# Patient Record
Sex: Female | Born: 1945 | Race: White | Hispanic: No | Marital: Single | Smoking: Former smoker
Health system: Southern US, Community
[De-identification: ages and names within clinical notes are randomized; demographics above are authoritative.]

## PROBLEM LIST (undated history)

## (undated) DIAGNOSIS — I639 Cerebral infarction, unspecified: Secondary | ICD-10-CM

## (undated) DIAGNOSIS — I6529 Occlusion and stenosis of unspecified carotid artery: Secondary | ICD-10-CM

## (undated) DIAGNOSIS — I10 Essential (primary) hypertension: Secondary | ICD-10-CM

## (undated) DIAGNOSIS — E785 Hyperlipidemia, unspecified: Secondary | ICD-10-CM

## (undated) HISTORY — DX: Hyperlipidemia, unspecified: E78.5

## (undated) HISTORY — PX: OTHER SURGICAL HISTORY: SHX169

## (undated) HISTORY — DX: Essential (primary) hypertension: I10

## (undated) HISTORY — DX: Cerebral infarction, unspecified: I63.9

## (undated) HISTORY — PX: FRACTURE SURGERY: SHX138

## (undated) HISTORY — PX: CHOLECYSTECTOMY: SHX55

## (undated) HISTORY — DX: Occlusion and stenosis of unspecified carotid artery: I65.29

---

## 2003-10-03 ENCOUNTER — Emergency Department (HOSPITAL_COMMUNITY): Admission: EM | Admit: 2003-10-03 | Discharge: 2003-10-03 | Payer: Self-pay | Admitting: Emergency Medicine

## 2003-10-05 ENCOUNTER — Inpatient Hospital Stay (HOSPITAL_COMMUNITY): Admission: RE | Admit: 2003-10-05 | Discharge: 2003-10-06 | Payer: Self-pay | Admitting: Orthopedic Surgery

## 2012-11-14 ENCOUNTER — Encounter: Payer: Self-pay | Admitting: Vascular Surgery

## 2012-11-14 ENCOUNTER — Other Ambulatory Visit: Payer: Self-pay | Admitting: *Deleted

## 2012-11-19 ENCOUNTER — Encounter: Payer: Self-pay | Admitting: Vascular Surgery

## 2012-11-19 ENCOUNTER — Other Ambulatory Visit: Payer: Self-pay

## 2012-12-18 ENCOUNTER — Other Ambulatory Visit: Payer: Self-pay

## 2012-12-18 ENCOUNTER — Encounter: Payer: Self-pay | Admitting: Vascular Surgery

## 2012-12-24 ENCOUNTER — Encounter: Payer: Self-pay | Admitting: Vascular Surgery

## 2012-12-25 ENCOUNTER — Ambulatory Visit (HOSPITAL_COMMUNITY)
Admission: RE | Admit: 2012-12-25 | Discharge: 2012-12-25 | Disposition: A | Discharge status: Home / Self Care | Payer: Medicare Other | Source: Ambulatory Visit | Attending: Vascular Surgery | Admitting: Vascular Surgery

## 2012-12-25 ENCOUNTER — Ambulatory Visit (INDEPENDENT_AMBULATORY_CARE_PROVIDER_SITE_OTHER): Payer: Medicare Other | Admitting: Vascular Surgery

## 2012-12-25 ENCOUNTER — Encounter: Payer: Self-pay | Admitting: Vascular Surgery

## 2012-12-25 DIAGNOSIS — I6529 Occlusion and stenosis of unspecified carotid artery: Secondary | ICD-10-CM | POA: Insufficient documentation

## 2012-12-25 NOTE — Progress Notes (Signed)
[facility] [place] HISTORY AND PHYSICAL   History of Present Illness:  Patient is a 67 y.o. 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 The patient had a known history of carotid stenosis. This is been followed with surveillance ultrasound. She was noted on recent ultrasound had bilateral internal carotid occlusions. This apparently occurred asymptomatically. The patient did have a stroke in 1995 which left her with some residual right leg weakness. She has had no further neurologic events since then. She denies any recent symptoms of TIA amaurosis or stroke. She has multiple atherosclerotic risk factors. She is on aspirin. He is on a statin agent..  Other medical problems include hypertension, hyperlipidemia prior history of stroke.  Past Medical History  Diagnosis Date  . Hypertension   . Carotid artery occlusion   . Hyperlipidemia   . Stroke     Past Surgical History  Procedure Laterality Date  . Cholecystectomy      Social History History  Substance Use Topics  . Smoking status: Former Smoker    Types: Cigarettes    Quit date: 03/26/1994  . Smokeless tobacco: Not on file  . Alcohol Use: No    Family History No family history on file. The patient has no history of early vascular disease of family members age less than 50. Allergies  Allergies  Allergen Reactions  . Doxycycline     Rash on hands     Current Outpatient Prescriptions  Medication Sig Dispense Refill  . amLODipine (NORVASC) 10 MG tablet Take 10 mg by mouth daily.      Marland Kitchen aspirin 325 MG tablet Take 325 mg by mouth daily.      Marland Kitchen atorvastatin (LIPITOR) 40 MG tablet Take 40 mg by mouth daily.      Marland Kitchen lisinopril-hydrochlorothiazide (PRINZIDE,ZESTORETIC) 20-25 MG per tablet Take 1 tablet by mouth daily.       No current facility-administered medications for this visit.    ROS:   General:  No weight loss, Fever, chills  HEENT: No recent  headaches, no nasal bleeding, no visual changes, no sore throat  Neurologic: No dizziness, blackouts, seizures. No recent symptoms of stroke or mini- stroke. No recent episodes of slurred speech, or temporary blindness.  Cardiac: No recent episodes of chest pain/pressure, no shortness of breath at rest.  No shortness of breath with exertion.  Denies history of atrial fibrillation or irregular heartbeat  Vascular: No history of rest pain in feet.  No history of claudication.  No history of non-healing ulcer, No history of DVT   Pulmonary: No home oxygen, no productive cough, no hemoptysis,  No asthma or wheezing  Musculoskeletal:  [ ]  Arthritis, [ ]  Low back pain,  [ ]  Joint pain  Hematologic:No history of hypercoagulable state.  No history of easy bleeding.  No history of anemia  Gastrointestinal: No hematochezia or melena,  No gastroesophageal reflux, no trouble swallowing  Urinary: [ ]  chronic Kidney disease, [ ]  on HD - [ ]  MWF or [ ]  TTHS, [ ]  Burning with urination, [ ]  Frequent urination, [ ]  Difficulty urinating;   Skin: No rashes  Psychological: No history of anxiety,  No history of depression   Physical Examination  Filed Vitals:   12/25/12 1110 12/25/12 1113  BP: 152/67 145/64  Pulse: 71 71  Height: 5\' 1"  (1.549 m)   Weight: 171 lb 1.6 oz (77.61 kg)   SpO2: 100%     Body mass index  is 32.35 kg/(m^2).  General:  Alert and oriented, no acute distress HEENT: Normal Neck: Harsh right side carotid bruit, absent left carotid bruit Pulmonary: Clear to auscultation bilaterally Cardiac: Regular Rate and Rhythm without murmur Abdomen: Soft, non-tender, non-distended, no mass Skin: No rash Extremity Pulses:  2+ radial, brachial, femoral bilaterally Musculoskeletal: No deformity or edema  Neurologic: Upper and lower extremity motor 5/5 and symmetric  DATA:  Carotid duplex scan from inside imaging was reviewed today which shows bilateral internal carotid artery occlusions.  There was antegrade vertebral flow bilaterally.   ASSESSMENT:  Bilateral internal carotid artery occlusion by ultrasound asymptomatic   PLAN:  Carotid angiogram to confirm ultrasound findings scheduled for 01/02/2013. Risks benefits possible complications and procedure details including but not limited to bleeding infection vessel injury contrast reaction stroke explained the patient today. She understands and agrees to proceed  Fabienne Bruns, MD [facility] [place] Office: [phone] Pager: [phone]

## 2012-12-29 ENCOUNTER — Other Ambulatory Visit: Payer: Self-pay

## 2013-01-01 MED ORDER — SODIUM CHLORIDE 0.9 % IV SOLN
INTRAVENOUS | Status: DC
  Administered 2013-01-02: 09:00:00 via INTRAVENOUS
  Administered 2013-01-02: 100 mL/h via INTRAVENOUS

## 2013-01-02 ENCOUNTER — Encounter (HOSPITAL_COMMUNITY): Payer: Self-pay | Admitting: *Deleted

## 2013-01-02 ENCOUNTER — Ambulatory Visit (HOSPITAL_COMMUNITY)
Admission: RE | Admit: 2013-01-02 | Discharge: 2013-01-02 | Disposition: A | Discharge status: Home / Self Care | Payer: Medicare Other | Source: Ambulatory Visit | Attending: Vascular Surgery | Admitting: Vascular Surgery

## 2013-01-02 ENCOUNTER — Encounter (HOSPITAL_COMMUNITY)
Admission: RE | Disposition: A | Discharge status: Home / Self Care | Payer: Self-pay | Source: Ambulatory Visit | Attending: Vascular Surgery

## 2013-01-02 DIAGNOSIS — E785 Hyperlipidemia, unspecified: Secondary | ICD-10-CM | POA: Insufficient documentation

## 2013-01-02 DIAGNOSIS — I658 Occlusion and stenosis of other precerebral arteries: Secondary | ICD-10-CM | POA: Insufficient documentation

## 2013-01-02 DIAGNOSIS — I69998 Other sequelae following unspecified cerebrovascular disease: Secondary | ICD-10-CM | POA: Insufficient documentation

## 2013-01-02 DIAGNOSIS — I10 Essential (primary) hypertension: Secondary | ICD-10-CM | POA: Insufficient documentation

## 2013-01-02 DIAGNOSIS — I6529 Occlusion and stenosis of unspecified carotid artery: Secondary | ICD-10-CM

## 2013-01-02 DIAGNOSIS — R29898 Other symptoms and signs involving the musculoskeletal system: Secondary | ICD-10-CM | POA: Insufficient documentation

## 2013-01-02 HISTORY — PX: CAROTID ANGIOGRAM: SHX5504

## 2013-01-02 LAB — POCT I-STAT, CHEM 8
BUN: 26 mg/dL — ABNORMAL HIGH (ref 6–23)
Calcium, Ion: 1.35 mmol/L — ABNORMAL HIGH (ref 1.13–1.30)
Chloride: 101 meq/L (ref 96–112)
Creatinine, Ser: 1.1 mg/dL (ref 0.50–1.10)
Glucose, Bld: 112 mg/dL — ABNORMAL HIGH (ref 70–99)
HCT: 39 % (ref 36.0–46.0)
Hemoglobin: 13.3 g/dL (ref 12.0–15.0)
Potassium: 4 meq/L (ref 3.5–5.1)
Sodium: 138 meq/L (ref 135–145)
TCO2: 27 mmol/L (ref 0–100)

## 2013-01-02 SURGERY — CAROTID ANGIOGRAM
Anesthesia: LOCAL

## 2013-01-02 MED ORDER — HEPARIN (PORCINE) IN NACL 2-0.9 UNIT/ML-% IJ SOLN
INTRAMUSCULAR | Status: AC
  Filled 2013-01-02: qty 1000

## 2013-01-02 MED ORDER — HYDRALAZINE HCL 20 MG/ML IJ SOLN: 10.0000 mg | INTRAMUSCULAR | Status: DC | PRN

## 2013-01-02 MED ORDER — ACETAMINOPHEN 325 MG PO TABS: 325.0000 mg | ORAL_TABLET | ORAL | Status: DC | PRN

## 2013-01-02 MED ORDER — MORPHINE SULFATE 10 MG/ML IJ SOLN: 2.0000 mg | INTRAMUSCULAR | Status: DC | PRN

## 2013-01-02 MED ORDER — METOPROLOL TARTRATE 1 MG/ML IV SOLN: 2.0000 mg | INTRAVENOUS | Status: DC | PRN

## 2013-01-02 MED ORDER — LABETALOL HCL 5 MG/ML IV SOLN: 10.0000 mg | INTRAVENOUS | Status: DC | PRN

## 2013-01-02 MED ORDER — SODIUM CHLORIDE 0.45 % IV SOLN: INTRAVENOUS | Status: DC

## 2013-01-02 MED ORDER — ACETAMINOPHEN 325 MG RE SUPP: 325.0000 mg | RECTAL | Status: DC | PRN

## 2013-01-02 MED ORDER — ONDANSETRON HCL 4 MG/2ML IJ SOLN: 4.0000 mg | Freq: Four times a day (QID) | INTRAMUSCULAR | Status: DC | PRN

## 2013-01-02 MED ORDER — LIDOCAINE HCL (PF) 1 % IJ SOLN
INTRAMUSCULAR | Status: AC
  Filled 2013-01-02: qty 30

## 2013-01-02 NOTE — Interval H&P Note (Signed)
History and Physical Interval Note:  01/02/2013 7:35 AM  Michele Ellis  has presented today for surgery, with the diagnosis of carotid stenosis  The various methods of treatment have been discussed with the patient and family. After consideration of risks, benefits and other options for treatment, the patient has consented to  Procedure(s): CAROTID ANGIOGRAM (N/A) as a surgical intervention .  The patient's history has been reviewed, patient examined, no change in status, stable for surgery.  I have reviewed the patient's chart and labs.  Questions were answered to the patient's satisfaction.     FIELDS,CHARLES E

## 2013-01-02 NOTE — Op Note (Addendum)
Procedure: Arch aortogram with bilateral carotid angiogram Preoperative diagnosis: Bilateral internal carotid artery occlusion Postoperative diagnosis: Right internal carotid stenosis, left internal carotid occlusion  Anesthesia: Local   Operative details: After obtaining informed consent, the patient was taken to the PV lab. The patient was placed in supine position the Angio table. Both groins were prepped and draped in usual sterile fashion. Ultrasound was used to identify the right common femoral artery.  Local anesthesia was infiltrated over the right common femoral artery. An introducer needle was placed into the right common femoral artery using ultrasound guidance and there was good backbleeding from this.  Next and 0.035 Versacore wire was threaded up into the abdominal aorta and into the aortic arch under fluoroscopic guidance. A 5 French sheath was  the guidewire the right common femoral artery. This was thoroughly flushed with heparinized saline. A 5 French pigtail catheter was then placed through the guidewire advanced up in the aortic arch and arch aortogram obtained in a 40 LAO view. This shows normal arch configuration with patent right subclavian artery. The right vertebral artery is patent with antegrade flow. The innominate artery is patent. The right common carotid artery is patent. The left common carotid artery has a shelf like plaque at the origin causing approximately 50% stenosis. The left subclavian artery is patent. The left vertebral artery is patent with antegrade flow.  The 5 French pigtail catheter was then pulled back over the guidewire and exchanged for a Berenstein 2 catheter. This was then used to selectively catheterize the right common carotid artery.Injection was then performed through the right common carotid artery to the define the right carotid system. Projections were done in AP and lateral projection. Intracranial views were also performed to be interpreted later  by the neuroradiologist.The right internal  Carotid has an irregular 80% stenosis.  The bifurcation is slightly high but the plaque ends below C2 and the angle of the mandible.  The external carotid is patent.    Next the Berenstein catheter was pulled back over guidewire and I was able to advance the versacore wire into the left common carotid artery.  the [place] was then exchanged over the wire for a 5 Fr straight slip cath.  After advancing the catheter into the left common carotid artery left carotid injection was performed also in AP and lateral projection. Intracranial views were also performed in AP and lateral projection to be interpreted by the neuroradiologist. The left internal is occluded and external carotid artery is patent.  Next, the Berenstein catheter was pulled back over the guidewire and out. The 5 French sheath was thoroughly flushed with heparinized saline. The patient was taken to the holding area in stable condition.  The patient tolerated the procedure well and there were no neurologic complications. The patient was transported to the holding area in stable condition. She was having a rainbow of colors consistent with ocular migraine at the end of the procedure corner of both eyes ? Left > right  Operative findings: left internal carotid occlusion.  80% right internal carotid stenosis  Disposition: Pt was offered left carotid endarterectomy for stroke prophylaxis.  She will be observed this afternoon to make sure her eye symptoms resolve.  The patient will call if she wishes to undergo CEA.  Fabienne Bruns, MD  [facility] [place]  Office: [phone]  Pager: [phone]

## 2013-01-02 NOTE — H&P (View-Only) (Signed)
[facility] [place] HISTORY AND PHYSICAL   History of Present Illness:  Patient is a 67 y.o. 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 The patient had a known history of carotid stenosis. This is been followed with surveillance ultrasound. She was noted on recent ultrasound had bilateral internal carotid occlusions. This apparently occurred asymptomatically. The patient did have a stroke in 1995 which left her with some residual right leg weakness. She has had no further neurologic events since then. She denies any recent symptoms of TIA amaurosis or stroke. She has multiple atherosclerotic risk factors. She is on aspirin. He is on a statin agent..  Other medical problems include hypertension, hyperlipidemia prior history of stroke.  Past Medical History  Diagnosis Date  . Hypertension   . Carotid artery occlusion   . Hyperlipidemia   . Stroke     Past Surgical History  Procedure Laterality Date  . Cholecystectomy      Social History History  Substance Use Topics  . Smoking status: Former Smoker    Types: Cigarettes    Quit date: 03/26/1994  . Smokeless tobacco: Not on file  . Alcohol Use: No    Family History No family history on file. The patient has no history of early vascular disease of family members age less than 50. Allergies  Allergies  Allergen Reactions  . Doxycycline     Rash on hands     Current Outpatient Prescriptions  Medication Sig Dispense Refill  . amLODipine (NORVASC) 10 MG tablet Take 10 mg by mouth daily.      . aspirin 325 MG tablet Take 325 mg by mouth daily.      . atorvastatin (LIPITOR) 40 MG tablet Take 40 mg by mouth daily.      . lisinopril-hydrochlorothiazide (PRINZIDE,ZESTORETIC) 20-25 MG per tablet Take 1 tablet by mouth daily.       No current facility-administered medications for this visit.    ROS:   General:  No weight loss, Fever, chills  HEENT: No recent  headaches, no nasal bleeding, no visual changes, no sore throat  Neurologic: No dizziness, blackouts, seizures. No recent symptoms of stroke or mini- stroke. No recent episodes of slurred speech, or temporary blindness.  Cardiac: No recent episodes of chest pain/pressure, no shortness of breath at rest.  No shortness of breath with exertion.  Denies history of atrial fibrillation or irregular heartbeat  Vascular: No history of rest pain in feet.  No history of claudication.  No history of non-healing ulcer, No history of DVT   Pulmonary: No home oxygen, no productive cough, no hemoptysis,  No asthma or wheezing  Musculoskeletal:  [ ] Arthritis, [ ] Low back pain,  [ ] Joint pain  Hematologic:No history of hypercoagulable state.  No history of easy bleeding.  No history of anemia  Gastrointestinal: No hematochezia or melena,  No gastroesophageal reflux, no trouble swallowing  Urinary: [ ] chronic Kidney disease, [ ] on HD - [ ] MWF or [ ] TTHS, [ ] Burning with urination, [ ] Frequent urination, [ ] Difficulty urinating;   Skin: No rashes  Psychological: No history of anxiety,  No history of depression   Physical Examination  Filed Vitals:   12/25/12 1110 12/25/12 1113  BP: 152/67 145/64  Pulse: 71 71  Height: 5' 1" (1.549 m)   Weight: 171 lb 1.6 oz (77.61 kg)   SpO2: 100%     Body mass index   is 32.35 kg/(m^2).  General:  Alert and oriented, no acute distress HEENT: Normal Neck: Harsh right side carotid bruit, absent left carotid bruit Pulmonary: Clear to auscultation bilaterally Cardiac: Regular Rate and Rhythm without murmur Abdomen: Soft, non-tender, non-distended, no mass Skin: No rash Extremity Pulses:  2+ radial, brachial, femoral bilaterally Musculoskeletal: No deformity or edema  Neurologic: Upper and lower extremity motor 5/5 and symmetric  DATA:  Carotid duplex scan from inside imaging was reviewed today which shows bilateral internal carotid artery occlusions.  There was antegrade vertebral flow bilaterally.   ASSESSMENT:  Bilateral internal carotid artery occlusion by ultrasound asymptomatic   PLAN:  Carotid angiogram to confirm ultrasound findings scheduled for 01/02/2013. Risks benefits possible complications and procedure details including but not limited to bleeding infection vessel injury contrast reaction stroke explained the patient today. She understands and agrees to proceed  Charles Fields, MD [facility] [place] Office: [phone] Pager: [phone]  

## 2013-01-08 ENCOUNTER — Telehealth: Payer: Self-pay | Admitting: Vascular Surgery

## 2013-01-08 ENCOUNTER — Other Ambulatory Visit: Payer: Self-pay | Admitting: *Deleted

## 2013-01-08 DIAGNOSIS — Z01810 Encounter for preprocedural cardiovascular examination: Secondary | ICD-10-CM

## 2013-01-08 NOTE — Telephone Encounter (Addendum)
Message copied by Fredrich Birks on Thu Jan 08, 2013  3:36 PM ------      Message from: Phillips Odor      Created: Wed Jan 07, 2013  5:33 PM      Regarding: needs cardiac clearance      Contact: [phone]       This is Dr. Darrick Penna pt.  She needs a Right CEA.  He recommends a cardiol eval. prior to surgery.  Please schedule appt. with cardiology group for cardiac clearance.  (She has never seen a cardiologist.) ------  Made appt for 01/23/13 @ 230 with Dr Eldridge Dace at [facility]. Patient is aware and I have given her the address and phone #, dpm

## 2013-01-23 ENCOUNTER — Encounter: Payer: Self-pay | Admitting: Interventional Cardiology

## 2013-01-23 ENCOUNTER — Ambulatory Visit (INDEPENDENT_AMBULATORY_CARE_PROVIDER_SITE_OTHER): Payer: Medicare Other | Admitting: Interventional Cardiology

## 2013-01-23 DIAGNOSIS — I10 Essential (primary) hypertension: Secondary | ICD-10-CM

## 2013-01-23 DIAGNOSIS — R9431 Abnormal electrocardiogram [ECG] [EKG]: Secondary | ICD-10-CM | POA: Insufficient documentation

## 2013-01-23 DIAGNOSIS — I6529 Occlusion and stenosis of unspecified carotid artery: Secondary | ICD-10-CM

## 2013-01-23 DIAGNOSIS — Z01810 Encounter for preprocedural cardiovascular examination: Secondary | ICD-10-CM | POA: Insufficient documentation

## 2013-01-23 NOTE — Patient Instructions (Signed)
Your physician has requested that you have a stress echocardiogram. For further information please visit [email or web address]. Please follow instruction sheet as given.   

## 2013-01-23 NOTE — Progress Notes (Signed)
Patient ID: Michele Ellis MRN: [id number] DOB/AGE: 04-15-1945 67 y.o.   Referring Physician Dr. Darrick Penna   Reason for Consultation: preoperative eval  HPI: 67 y/o who needs carotid endarterectomy.  She has not had any recent sx.  She does not have any CP or SHOB.  No CP, SHOB.  Very rare flutters lasting a few seconds.  No lightheadedness.  No syncope. No cardiac symptoms with minimal activity that she doesn't this time. She never had significant cardiac symptoms which required testing in the past. She is hoping to have the surgery done such that she can make it to [place] for a family trip.    Current Outpatient Prescriptions  Medication Sig Dispense Refill  . amLODipine (NORVASC) 10 MG tablet Take 10 mg by mouth daily.      Marland Kitchen aspirin 325 MG tablet Take 325 mg by mouth daily.      Marland Kitchen atorvastatin (LIPITOR) 40 MG tablet Take 40 mg by mouth every evening.       Marland Kitchen lisinopril-hydrochlorothiazide (PRINZIDE,ZESTORETIC) 20-25 MG per tablet Take 1 tablet by mouth daily.      Bertram Gala Glycol-Propyl Glycol (SYSTANE) 0.4-0.3 % SOLN Apply 1-2 drops to eye daily as needed (for dry eyes).       No current facility-administered medications for this visit.   Past Medical History  Diagnosis Date  . Hypertension   . Carotid artery occlusion   . Hyperlipidemia   . Stroke     Family History  Problem Relation Age of Onset  . Cancer Father     History   Social History  . Marital Status: Single    Spouse Name: N/A    Number of Children: N/A  . Years of Education: N/A   Occupational History  . Not on file.   Social History Main Topics  . Smoking status: Former Smoker    Types: Cigarettes    Quit date: 03/26/1994  . Smokeless tobacco: Not on file  . Alcohol Use: No  . Drug Use: No  . Sexual Activity: Not on file   Other Topics Concern  . Not on file   Social History Narrative  . No narrative on file    Past Surgical History  Procedure Laterality Date  .  Cholecystectomy        (Not in a hospital admission)  Review of systems complete and found to be negative unless listed above .  No nausea, vomiting.  No fever chills, No focal weakness,  No palpitations.  Physical Exam: Filed Vitals:   01/23/13 1441  BP: 156/70  Pulse: 80    Weight: 166 lb (75.297 kg)  Physical exam:  [place]/AT EOMI No JVD, bilateral carotid bruit RRR S1S2  No wheezing Soft. NT, nondistended No edema. No focal motor or sensory deficits Normal affect  Labs:   Lab Results  Component Value Date   HGB 13.3 01/02/2013   HCT 39.0 01/02/2013   No results found for this basename: NA, K, CL, CO2, BUN, CREATININE, CALCIUM, LABALBU, PROT, BILITOT, ALKPHOS, ALT, AST, GLUCOSE,  in the last 168 hours No results found for this basename: CKTOTAL, CKMB, CKMBINDEX, TROPONINI    No results found for this basename: CHOL   No results found for this basename: HDL   No results found for this basename: LDLCALC   No results found for this basename: TRIG   No results found for this basename: CHOLHDL   No results found for this basename: LDLDIRECT  EKG: Normal sinus rhythm, ST depressions in the inferior and lateral leads  ASSESSMENT AND PLAN:   1. Preoperative evaluation: Plan for ischemia evaluation given the fact that she is not very active. She has no symptoms, but she does not really reached a 4 MET equivalent with her daily activities. 2. abnormal ECG: She has ST depressions inferior and laterally at baseline. We would not be able to interpret a regular exercise treadmill test. 3. peripheral vascular disease: Bilateral carotid disease of significance. Left internal carotid is occluded. Right internal carotid has a significant blockage which will require endarterectomy. 4. hypertension: Blood pressure typically better controlled outside the doctor's office. Continue current medications. Signed:   Fredric Mare, MD, [facility] 01/23/2013, 2:59 PM

## 2013-01-24 NOTE — Consult Note (Signed)
[name], Michele Ellis              ACCOUNT NO.:  [id number]  MEDICAL RECORD NO.:  [id number]  LOCATION:  [facility]                         FACILITY:  [facility]  PHYSICIAN:  Fredrick Geoghegan K. Oluwadara Gorman, M.D.DATE OF BIRTH:  10/30/1945  DATE OF CONSULTATION: DATE OF DISCHARGE:  01/02/2013                                CONSULTATION   EXAMINATION:  Intracranial interpretation of bilateral common carotid arteriograms.  FINDINGS:  The right common carotid arteriogram demonstrates the distal cervical, petrous junction of the internal carotid artery to be normal.  The cavernous and the supraclinoid segments are widely patent.  The right middle and the right anterior cerebral arteries  opacify  into the capillary and venous phases.  There  is  suggestion of a mild stenosis of the right middle cerebral artery M1 segment probably related to intracranial arteriosclerosis.  The left common carotid arteriogram demonstrates a complete angiographic  occlusion  of the left internal carotid artery.  No visualization of the internal carotid artery is seen.  The left external carotid artery demonstrates  exuberant collaterals arising from the ipsilateral ophthalmic artery reconstituting the left internal carotid artery in its petrous segment and subsequently the cavernous and supraclinoid segments.  There is opacification of the left middle and left anterior cerebral arteries.  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  IMPRESSION: 1. Angiographic complete occlusion of the left internal carotid artery     with partial reconstitution from extensive collaterals arising from     the left internal maxillary artery and the ophthalmic artery     reconstituting the left internal carotid artery in the petrous,     cavernous, and the supraclinoid segments.  Opacification of the     left middle and the  left anterior cerebral arteries  is  seen. 2. 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 3. There was no cross opacification via the anterior communicating     artery of the left anterior cerebral artery circulation.          ______________________________ Michele Ellis, M.D.     SKD/MEDQ  D:  01/23/2013  T:  01/24/2013  Job:  [id number]

## 2013-01-30 ENCOUNTER — Ambulatory Visit (HOSPITAL_COMMUNITY)
Admission: RE | Admit: 2013-01-30 | Discharge: 2013-01-30 | Disposition: A | Discharge status: Home / Self Care | Payer: Medicare Other | Source: Ambulatory Visit | Attending: Interventional Cardiology | Admitting: Interventional Cardiology

## 2013-01-30 DIAGNOSIS — Z01810 Encounter for preprocedural cardiovascular examination: Secondary | ICD-10-CM

## 2013-01-30 DIAGNOSIS — I10 Essential (primary) hypertension: Secondary | ICD-10-CM | POA: Insufficient documentation

## 2013-01-30 DIAGNOSIS — R9431 Abnormal electrocardiogram [ECG] [EKG]: Secondary | ICD-10-CM

## 2013-01-30 NOTE — Progress Notes (Signed)
Echocardiogram Echocardiogram Stress Test has been performed.  Michele Ellis 01/30/2013, 11:54 AM

## 2013-02-04 ENCOUNTER — Other Ambulatory Visit: Payer: Self-pay | Admitting: *Deleted

## 2013-02-04 ENCOUNTER — Encounter: Payer: Self-pay | Admitting: *Deleted

## 2013-02-05 ENCOUNTER — Encounter (HOSPITAL_COMMUNITY): Payer: Self-pay | Admitting: Pharmacy Technician

## 2013-02-05 NOTE — Pre-Procedure Instructions (Signed)
Michele Ellis  02/05/2013   Your procedure is scheduled on: Wednesday, November 19th    Report to [facility]  2 * 3 at 6:30 AM.   Call this number if you have problems the morning of surgery: [phone]             This number [phone] after 6pm the night before.   Remember:   Do not eat food or drink liquids after midnight Tuesday.   Take these medicines the morning of surgery with A SIP OF WATER: Norvasc   Do not wear jewelry, make-up or nail polish.  Do not wear lotions, powders, or perfumes. You may wear deodorant.  Do not shave underarms & legs 48 hours prior to surgery.    Do not bring valuables to the hospital.  [facility] is not responsible for any belongings or valuables.               Contacts, dentures or bridgework may not be worn into surgery.  Leave suitcase in the car. After surgery it may be brought to your room.  For patients admitted to the hospital, discharge time is determined by your treatment team.   Name and phone number of your driver:    Special Instructions: Shower using CHG 2 nights before surgery and the night before surgery.  If you shower the day of surgery use CHG.  Use special wash - you have one bottle of CHG for all showers.  You should use approximately 1/3 of the bottle for each shower. N/A   Please read over the following fact sheets that you were given: Pain Booklet, Blood Transfusion Information, MRSA Information and Surgical Site Infection Prevention

## 2013-02-06 ENCOUNTER — Encounter (HOSPITAL_COMMUNITY)
Admission: RE | Admit: 2013-02-06 | Discharge: 2013-02-06 | Disposition: A | Discharge status: Home / Self Care | Payer: Medicare Other | Source: Ambulatory Visit | Attending: Anesthesiology | Admitting: Anesthesiology

## 2013-02-06 ENCOUNTER — Encounter (HOSPITAL_COMMUNITY): Payer: Self-pay

## 2013-02-06 ENCOUNTER — Other Ambulatory Visit: Payer: Self-pay | Admitting: *Deleted

## 2013-02-06 ENCOUNTER — Telehealth: Payer: Self-pay | Admitting: *Deleted

## 2013-02-06 ENCOUNTER — Encounter (HOSPITAL_COMMUNITY)
Admission: RE | Admit: 2013-02-06 | Discharge: 2013-02-06 | Disposition: A | Discharge status: Home / Self Care | Payer: Medicare Other | Source: Ambulatory Visit | Attending: Vascular Surgery | Admitting: Vascular Surgery

## 2013-02-06 DIAGNOSIS — A4901 Methicillin susceptible Staphylococcus aureus infection, unspecified site: Secondary | ICD-10-CM

## 2013-02-06 DIAGNOSIS — Z01812 Encounter for preprocedural laboratory examination: Secondary | ICD-10-CM | POA: Insufficient documentation

## 2013-02-06 DIAGNOSIS — Z01818 Encounter for other preprocedural examination: Secondary | ICD-10-CM | POA: Insufficient documentation

## 2013-02-06 LAB — COMPREHENSIVE METABOLIC PANEL
ALT: 45 U/L — ABNORMAL HIGH (ref 0–35)
CO2: 26 mEq/L (ref 19–32)
Calcium: 10 mg/dL (ref 8.4–10.5)
Chloride: 102 mEq/L (ref 96–112)
Creatinine, Ser: 1 mg/dL (ref 0.50–1.10)
GFR calc Af Amer: 66 mL/min — ABNORMAL LOW (ref >90)
GFR calc non Af Amer: 57 mL/min — ABNORMAL LOW (ref >90)
Glucose, Bld: 125 mg/dL — ABNORMAL HIGH (ref 70–99)
Potassium: 3.9 mEq/L (ref 3.5–5.1)
Sodium: 141 mEq/L (ref 135–145)
Total Bilirubin: 0.4 mg/dL (ref 0.3–1.2)

## 2013-02-06 LAB — CBC
Hemoglobin: 12.8 g/dL (ref 12.0–15.0)
MCH: 29.6 pg (ref 26.0–34.0)
MCV: 87.7 fL (ref 78.0–100.0)
Platelets: 231 10*3/uL (ref 150–400)
RBC: 4.32 MIL/uL (ref 3.87–5.11)
RDW: 14.8 % (ref 11.5–15.5)
WBC: 9.6 10*3/uL (ref 4.0–10.5)

## 2013-02-06 LAB — URINALYSIS, ROUTINE W REFLEX MICROSCOPIC
Bilirubin Urine: NEGATIVE
Glucose, UA: NEGATIVE mg/dL
Hgb urine dipstick: NEGATIVE
Protein, ur: NEGATIVE mg/dL
pH: 5 (ref 5.0–8.0)

## 2013-02-06 LAB — URINE MICROSCOPIC-ADD ON

## 2013-02-06 LAB — SURGICAL PCR SCREEN
MRSA, PCR: NEGATIVE
Staphylococcus aureus: POSITIVE — AB

## 2013-02-06 LAB — ABO/RH: ABO/RH(D): O POS

## 2013-02-06 LAB — TYPE AND SCREEN: ABO/RH(D): O POS

## 2013-02-06 MED ORDER — MUPIROCIN 2 % EX OINT: TOPICAL_OINTMENT | CUTANEOUS | Status: DC

## 2013-02-06 NOTE — Progress Notes (Signed)
Patient was recently taken off lipitor d/t elevated liver enyzmes....recent bloodwork at PCP's office (tuesday) showed LFT up.   DA  (went in for a repeat cholesterol check)

## 2013-02-06 NOTE — Telephone Encounter (Signed)
Debbie,RN in short stay requested [facility] call in to [facility] a prescription for Mupirocin to cover positive swap for Stap Aureous. It was faxed to them.

## 2013-02-10 MED ORDER — DEXTROSE 5 % IV SOLN
1.5000 g | INTRAVENOUS | Status: AC
  Administered 2013-02-11: 1.5 g via INTRAVENOUS
  Filled 2013-02-10: qty 1.5

## 2013-02-11 ENCOUNTER — Inpatient Hospital Stay (HOSPITAL_COMMUNITY)
Admission: RE | Admit: 2013-02-11 | Discharge: 2013-02-12 | DRG: 039 | Disposition: A | Discharge status: Home / Self Care | Payer: Medicare Other | Source: Ambulatory Visit | Attending: Vascular Surgery | Admitting: Vascular Surgery

## 2013-02-11 ENCOUNTER — Encounter (HOSPITAL_COMMUNITY): Payer: Self-pay | Admitting: *Deleted

## 2013-02-11 ENCOUNTER — Encounter (HOSPITAL_COMMUNITY)
Admission: RE | Disposition: A | Discharge status: Home / Self Care | Payer: Self-pay | Source: Ambulatory Visit | Attending: Vascular Surgery

## 2013-02-11 ENCOUNTER — Encounter (HOSPITAL_COMMUNITY): Payer: Medicare Other | Admitting: Anesthesiology

## 2013-02-11 ENCOUNTER — Telehealth: Payer: Self-pay | Admitting: Vascular Surgery

## 2013-02-11 ENCOUNTER — Inpatient Hospital Stay (HOSPITAL_COMMUNITY): Payer: Medicare Other | Admitting: Anesthesiology

## 2013-02-11 DIAGNOSIS — I6529 Occlusion and stenosis of unspecified carotid artery: Secondary | ICD-10-CM

## 2013-02-11 DIAGNOSIS — I69998 Other sequelae following unspecified cerebrovascular disease: Secondary | ICD-10-CM

## 2013-02-11 DIAGNOSIS — Z87891 Personal history of nicotine dependence: Secondary | ICD-10-CM

## 2013-02-11 DIAGNOSIS — E785 Hyperlipidemia, unspecified: Secondary | ICD-10-CM | POA: Diagnosis present

## 2013-02-11 DIAGNOSIS — Z7982 Long term (current) use of aspirin: Secondary | ICD-10-CM

## 2013-02-11 DIAGNOSIS — Z79899 Other long term (current) drug therapy: Secondary | ICD-10-CM

## 2013-02-11 DIAGNOSIS — I10 Essential (primary) hypertension: Secondary | ICD-10-CM | POA: Diagnosis present

## 2013-02-11 HISTORY — PX: PATCH ANGIOPLASTY: SHX6230

## 2013-02-11 HISTORY — PX: CAROTID ENDARTERECTOMY: SUR193

## 2013-02-11 HISTORY — PX: ENDARTERECTOMY: SHX5162

## 2013-02-11 SURGERY — ENDARTERECTOMY, CAROTID
Anesthesia: General | Site: Neck | Laterality: Right | Wound class: Clean

## 2013-02-11 MED ORDER — PANTOPRAZOLE SODIUM 40 MG PO TBEC
40.0000 mg | DELAYED_RELEASE_TABLET | Freq: Every day | ORAL | Status: DC
  Administered 2013-02-12: 40 mg via ORAL
  Filled 2013-02-11 (×2): qty 1

## 2013-02-11 MED ORDER — KETOROLAC TROMETHAMINE 30 MG/ML IJ SOLN: 15.0000 mg | Freq: Once | INTRAMUSCULAR | Status: DC | PRN

## 2013-02-11 MED ORDER — LIDOCAINE HCL (CARDIAC) 20 MG/ML IV SOLN
INTRAVENOUS | Status: DC | PRN
  Administered 2013-02-11: 60 mg via INTRAVENOUS
  Administered 2013-02-11: 40 mg via INTRAVENOUS

## 2013-02-11 MED ORDER — SODIUM CHLORIDE 0.9 % IV SOLN: INTRAVENOUS | Status: DC

## 2013-02-11 MED ORDER — OXYCODONE-ACETAMINOPHEN 5-325 MG PO TABS: 1.0000 | ORAL_TABLET | ORAL | Status: DC | PRN

## 2013-02-11 MED ORDER — HYDRALAZINE HCL 20 MG/ML IJ SOLN: 10.0000 mg | INTRAMUSCULAR | Status: DC | PRN

## 2013-02-11 MED ORDER — NEOSTIGMINE METHYLSULFATE 1 MG/ML IJ SOLN
INTRAMUSCULAR | Status: DC | PRN
  Administered 2013-02-11: 3 mg via INTRAVENOUS

## 2013-02-11 MED ORDER — EPHEDRINE SULFATE 50 MG/ML IJ SOLN
INTRAMUSCULAR | Status: DC | PRN
  Administered 2013-02-11: 10 mg via INTRAVENOUS

## 2013-02-11 MED ORDER — ONDANSETRON HCL 4 MG/2ML IJ SOLN
INTRAMUSCULAR | Status: DC | PRN
  Administered 2013-02-11: 4 mg via INTRAVENOUS

## 2013-02-11 MED ORDER — POLYVINYL ALCOHOL 1.4 % OP SOLN
1.0000 [drp] | Freq: Every day | OPHTHALMIC | Status: DC | PRN
  Filled 2013-02-11: qty 15

## 2013-02-11 MED ORDER — ARTIFICIAL TEARS OP OINT
TOPICAL_OINTMENT | OPHTHALMIC | Status: DC | PRN
  Administered 2013-02-11: 1 via OPHTHALMIC

## 2013-02-11 MED ORDER — ASPIRIN 325 MG PO TABS
325.0000 mg | ORAL_TABLET | Freq: Every day | ORAL | Status: DC
  Administered 2013-02-12: 325 mg via ORAL
  Filled 2013-02-11: qty 1

## 2013-02-11 MED ORDER — ACETAMINOPHEN 325 MG PO TABS: 325.0000 mg | ORAL_TABLET | ORAL | Status: DC | PRN

## 2013-02-11 MED ORDER — SODIUM CHLORIDE 0.9 % IR SOLN
Status: DC | PRN
  Administered 2013-02-11: 09:00:00

## 2013-02-11 MED ORDER — FENTANYL CITRATE 0.05 MG/ML IJ SOLN
INTRAMUSCULAR | Status: DC | PRN
  Administered 2013-02-11: 100 ug via INTRAVENOUS
  Administered 2013-02-11: 50 ug via INTRAVENOUS

## 2013-02-11 MED ORDER — THROMBIN 20000 UNITS EX SOLR
CUTANEOUS | Status: AC
  Filled 2013-02-11: qty 20000

## 2013-02-11 MED ORDER — MIDAZOLAM HCL 5 MG/5ML IJ SOLN
INTRAMUSCULAR | Status: DC | PRN
  Administered 2013-02-11: 1 mg via INTRAVENOUS

## 2013-02-11 MED ORDER — AMLODIPINE BESYLATE 10 MG PO TABS
10.0000 mg | ORAL_TABLET | Freq: Every day | ORAL | Status: DC
  Administered 2013-02-12: 10 mg via ORAL
  Filled 2013-02-11: qty 1

## 2013-02-11 MED ORDER — ALUM & MAG HYDROXIDE-SIMETH 200-200-20 MG/5ML PO SUSP: 15.0000 mL | ORAL | Status: DC | PRN

## 2013-02-11 MED ORDER — MORPHINE SULFATE 2 MG/ML IJ SOLN
2.0000 mg | INTRAMUSCULAR | Status: DC | PRN
  Administered 2013-02-11: 2 mg via INTRAVENOUS
  Filled 2013-02-11: qty 1

## 2013-02-11 MED ORDER — 0.9 % SODIUM CHLORIDE (POUR BTL) OPTIME
TOPICAL | Status: DC | PRN
  Administered 2013-02-11: 2000 mL

## 2013-02-11 MED ORDER — POLYETHYL GLYCOL-PROPYL GLYCOL 0.4-0.3 % OP SOLN: 1.0000 [drp] | Freq: Every day | OPHTHALMIC | Status: DC | PRN

## 2013-02-11 MED ORDER — PHENOL 1.4 % MT LIQD: 1.0000 | OROMUCOSAL | Status: DC | PRN

## 2013-02-11 MED ORDER — ONDANSETRON HCL 4 MG/2ML IJ SOLN: 4.0000 mg | Freq: Four times a day (QID) | INTRAMUSCULAR | Status: DC | PRN

## 2013-02-11 MED ORDER — ACETAMINOPHEN 650 MG RE SUPP: 325.0000 mg | RECTAL | Status: DC | PRN

## 2013-02-11 MED ORDER — SODIUM CHLORIDE 0.9 % IV SOLN
500.0000 mL | Freq: Once | INTRAVENOUS | Status: AC | PRN
  Administered 2013-02-11: 500 mL via INTRAVENOUS

## 2013-02-11 MED ORDER — GLYCOPYRROLATE 0.2 MG/ML IJ SOLN
INTRAMUSCULAR | Status: DC | PRN
  Administered 2013-02-11: 0.4 mg via INTRAVENOUS

## 2013-02-11 MED ORDER — POTASSIUM CHLORIDE CRYS ER 20 MEQ PO TBCR: 20.0000 meq | EXTENDED_RELEASE_TABLET | Freq: Once | ORAL | Status: AC | PRN

## 2013-02-11 MED ORDER — MORPHINE SULFATE 2 MG/ML IJ SOLN
INTRAMUSCULAR | Status: AC
  Administered 2013-02-11: 2 mg via INTRAVENOUS
  Filled 2013-02-11: qty 1

## 2013-02-11 MED ORDER — METOPROLOL TARTRATE 1 MG/ML IV SOLN: 2.0000 mg | INTRAVENOUS | Status: DC | PRN

## 2013-02-11 MED ORDER — GUAIFENESIN-DM 100-10 MG/5ML PO SYRP: 15.0000 mL | ORAL_SOLUTION | ORAL | Status: DC | PRN

## 2013-02-11 MED ORDER — VECURONIUM BROMIDE 10 MG IV SOLR
INTRAVENOUS | Status: DC | PRN
  Administered 2013-02-11: 1 mg via INTRAVENOUS

## 2013-02-11 MED ORDER — LISINOPRIL-HYDROCHLOROTHIAZIDE 20-25 MG PO TABS: 1.0000 | ORAL_TABLET | Freq: Every day | ORAL | Status: DC

## 2013-02-11 MED ORDER — DEXTROSE 5 % IV SOLN
1.5000 g | Freq: Two times a day (BID) | INTRAVENOUS | Status: DC
  Filled 2013-02-11 (×2): qty 1.5

## 2013-02-11 MED ORDER — LIDOCAINE HCL (PF) 1 % IJ SOLN
INTRAMUSCULAR | Status: AC
  Filled 2013-02-11: qty 30

## 2013-02-11 MED ORDER — PROTAMINE SULFATE 10 MG/ML IV SOLN
INTRAVENOUS | Status: DC | PRN
  Administered 2013-02-11: 10 mg via INTRAVENOUS

## 2013-02-11 MED ORDER — PHENYLEPHRINE HCL 10 MG/ML IJ SOLN
10.0000 mg | INTRAVENOUS | Status: DC | PRN
  Administered 2013-02-11: 25 ug/min via INTRAVENOUS

## 2013-02-11 MED ORDER — DOCUSATE SODIUM 100 MG PO CAPS
100.0000 mg | ORAL_CAPSULE | Freq: Every day | ORAL | Status: DC
  Administered 2013-02-12: 100 mg via ORAL
  Filled 2013-02-11: qty 1

## 2013-02-11 MED ORDER — LABETALOL HCL 5 MG/ML IV SOLN: 10.0000 mg | INTRAVENOUS | Status: DC | PRN

## 2013-02-11 MED ORDER — HEPARIN SODIUM (PORCINE) 1000 UNIT/ML IJ SOLN
INTRAMUSCULAR | Status: DC | PRN
  Administered 2013-02-11: 8000 [IU] via INTRAVENOUS

## 2013-02-11 MED ORDER — HYDROCHLOROTHIAZIDE 25 MG PO TABS
25.0000 mg | ORAL_TABLET | Freq: Every day | ORAL | Status: DC
  Administered 2013-02-12: 25 mg via ORAL
  Filled 2013-02-11: qty 1

## 2013-02-11 MED ORDER — PROPOFOL 10 MG/ML IV BOLUS
INTRAVENOUS | Status: DC | PRN
  Administered 2013-02-11: 120 mg via INTRAVENOUS

## 2013-02-11 MED ORDER — HYDROMORPHONE HCL PF 1 MG/ML IJ SOLN: 0.2500 mg | INTRAMUSCULAR | Status: DC | PRN

## 2013-02-11 MED ORDER — LISINOPRIL 20 MG PO TABS
20.0000 mg | ORAL_TABLET | Freq: Every day | ORAL | Status: DC
  Administered 2013-02-12: 20 mg via ORAL
  Filled 2013-02-11: qty 1

## 2013-02-11 MED ORDER — ROCURONIUM BROMIDE 100 MG/10ML IV SOLN
INTRAVENOUS | Status: DC | PRN
  Administered 2013-02-11: 40 mg via INTRAVENOUS
  Administered 2013-02-11: 10 mg via INTRAVENOUS

## 2013-02-11 MED ORDER — ONDANSETRON HCL 4 MG/2ML IJ SOLN: 4.0000 mg | Freq: Once | INTRAMUSCULAR | Status: DC | PRN

## 2013-02-11 MED ORDER — LACTATED RINGERS IV SOLN
INTRAVENOUS | Status: DC | PRN
  Administered 2013-02-11 (×2): via INTRAVENOUS

## 2013-02-11 MED ORDER — DEXTROSE 5 % IV SOLN
1.5000 g | Freq: Two times a day (BID) | INTRAVENOUS | Status: AC
  Administered 2013-02-11 – 2013-02-12 (×2): 1.5 g via INTRAVENOUS
  Filled 2013-02-11 (×2): qty 1.5

## 2013-02-11 SURGICAL SUPPLY — 55 items
ADH SKN CLS APL DERMABOND .7 (GAUZE/BANDAGES/DRESSINGS) ×2
CANISTER SUCTION 2500CC (MISCELLANEOUS) ×3 IMPLANT
CANNULA VESSEL 3MM 2 BLNT TIP (CANNULA) ×3 IMPLANT
CATH ROBINSON RED A/P 18FR (CATHETERS) ×3 IMPLANT
CLIP TI MEDIUM 6 (CLIP) ×3 IMPLANT
CLIP TI WIDE RED SMALL 6 (CLIP) ×3 IMPLANT
CONT SPEC STER OR (MISCELLANEOUS) ×1 IMPLANT
COVER SURGICAL LIGHT HANDLE (MISCELLANEOUS) ×3 IMPLANT
CRADLE DONUT ADULT HEAD (MISCELLANEOUS) ×3 IMPLANT
DECANTER SPIKE VIAL GLASS SM (MISCELLANEOUS) IMPLANT
DERMABOND ADVANCED (GAUZE/BANDAGES/DRESSINGS) ×1
DERMABOND ADVANCED .7 DNX12 (GAUZE/BANDAGES/DRESSINGS) ×2 IMPLANT
DRAIN HEMOVAC 1/8 X 5 (WOUND CARE) IMPLANT
DRAPE WARM FLUID 44X44 (DRAPE) ×3 IMPLANT
ELECT CAUTERY BLADE 6.4 (BLADE) ×1 IMPLANT
ELECT REM PT RETURN 9FT ADLT (ELECTROSURGICAL) ×3
ELECTRODE REM PT RTRN 9FT ADLT (ELECTROSURGICAL) ×2 IMPLANT
EVACUATOR SILICONE 100CC (DRAIN) IMPLANT
GEL ULTRASOUND 20GR AQUASONIC (MISCELLANEOUS) IMPLANT
GLOVE BIO SURGEON STRL SZ 6.5 (GLOVE) ×1 IMPLANT
GLOVE BIO SURGEON STRL SZ7.5 (GLOVE) ×3 IMPLANT
GLOVE BIOGEL PI IND STRL 7.0 (GLOVE) IMPLANT
GLOVE BIOGEL PI IND STRL 7.5 (GLOVE) IMPLANT
GLOVE BIOGEL PI INDICATOR 7.0 (GLOVE) ×2
GLOVE BIOGEL PI INDICATOR 7.5 (GLOVE) ×1
GLOVE SS BIOGEL STRL SZ 7 (GLOVE) IMPLANT
GLOVE SUPERSENSE BIOGEL SZ 7 (GLOVE) ×1
GOWN PREVENTION PLUS XLARGE (GOWN DISPOSABLE) ×3 IMPLANT
GOWN STRL NON-REIN LRG LVL3 (GOWN DISPOSABLE) ×8 IMPLANT
KIT BASIN OR (CUSTOM PROCEDURE TRAY) ×3 IMPLANT
KIT ROOM TURNOVER OR (KITS) ×3 IMPLANT
KIT SHUNT ARGYLE CAROTID ART 6 (VASCULAR PRODUCTS) ×1 IMPLANT
LOOP VESSEL MINI RED (MISCELLANEOUS) ×1 IMPLANT
NDL HYPO 25GX1X1/2 BEV (NEEDLE) IMPLANT
NEEDLE HYPO 25GX1X1/2 BEV (NEEDLE) ×3 IMPLANT
NS IRRIG 1000ML POUR BTL (IV SOLUTION) ×6 IMPLANT
PACK CAROTID (CUSTOM PROCEDURE TRAY) ×3 IMPLANT
PAD ARMBOARD 7.5X6 YLW CONV (MISCELLANEOUS) ×6 IMPLANT
PATCH HEMASHIELD 8X75 (Vascular Products) ×1 IMPLANT
PENCIL BUTTON HOLSTER BLD 10FT (ELECTRODE) ×1 IMPLANT
SHUNT CAROTID BYPASS 10 (VASCULAR PRODUCTS) ×1 IMPLANT
SHUNT CAROTID BYPASS 12FRX15.5 (VASCULAR PRODUCTS) IMPLANT
SPONGE SURGIFOAM ABS GEL 100 (HEMOSTASIS) IMPLANT
SUT ETHILON 3 0 PS 1 (SUTURE) IMPLANT
SUT PROLENE 6 0 CC (SUTURE) ×4 IMPLANT
SUT PROLENE 7 0 BV 1 (SUTURE) ×2 IMPLANT
SUT SILK 3 0 TIES 17X18 (SUTURE)
SUT SILK 3-0 18XBRD TIE BLK (SUTURE) IMPLANT
SUT VIC AB 3-0 SH 27 (SUTURE) ×3
SUT VIC AB 3-0 SH 27X BRD (SUTURE) ×2 IMPLANT
SUT VICRYL 4-0 PS2 18IN ABS (SUTURE) ×3 IMPLANT
SYR CONTROL 10ML LL (SYRINGE) IMPLANT
TOWEL OR 17X24 6PK STRL BLUE (TOWEL DISPOSABLE) ×3 IMPLANT
TOWEL OR 17X26 10 PK STRL BLUE (TOWEL DISPOSABLE) ×3 IMPLANT
WATER STERILE IRR 1000ML POUR (IV SOLUTION) ×3 IMPLANT

## 2013-02-11 NOTE — Anesthesia Preprocedure Evaluation (Addendum)
Anesthesia Evaluation  Patient identified by MRN, date of birth, ID band Patient awake    Reviewed: Allergy & Precautions, H&P , NPO status , Patient's Chart, lab work & pertinent test results  Airway Mallampati: II TM Distance: >3 FB Neck ROM: Full    Dental  (+) Edentulous Upper and Teeth Intact   Pulmonary former smoker,  breath sounds clear to auscultation        Cardiovascular hypertension, Rhythm:Regular Rate:Normal     Neuro/Psych    GI/Hepatic   Endo/Other    Renal/GU      Musculoskeletal   Abdominal   Peds  Hematology   Anesthesia Other Findings   Reproductive/Obstetrics                          Anesthesia Physical Anesthesia Plan  ASA: III  Anesthesia Plan: General   Post-op Pain Management:    Induction: Intravenous  Airway Management Planned: Oral ETT  Additional Equipment: Arterial line  Intra-op Plan:   Post-operative Plan: Extubation in OR  Informed Consent: I have reviewed the patients History and Physical, chart, labs and discussed the procedure including the risks, benefits and alternatives for the proposed anesthesia with the patient or authorized representative who has indicated his/her understanding and acceptance.   Dental advisory given  Plan Discussed with: CRNA, Anesthesiologist and Surgeon  Anesthesia Plan Comments:         Anesthesia Quick Evaluation

## 2013-02-11 NOTE — Transfer of Care (Signed)
Immediate Anesthesia Transfer of Care Note  Patient: Michele Ellis  Procedure(s) Performed: Procedure(s): ENDARTERECTOMY CAROTID (Right) PATCH ANGIOPLASTY OF RIGHT CAROTID ARTERY  USING HEMASHIELD PLATINUM FINESSE PATCH (Right)  Patient Location: PACU  Anesthesia Type:General  Level of Consciousness: awake, alert , oriented and patient cooperative  Airway & Oxygen Therapy: Patient Spontanous Breathing and Patient connected to nasal cannula oxygen  Post-op Assessment: Report given to PACU RN, Post -op Vital signs reviewed and stable, Patient moving all extremities X 4 and Patient able to stick tongue midline  Post vital signs: Reviewed and stable  Complications: No apparent anesthesia complications

## 2013-02-11 NOTE — Progress Notes (Signed)
No swallowing difficulties.  She does complain of nausea.  BP stable.  Char Feltman MAUREEN PA-C

## 2013-02-11 NOTE — Preoperative (Signed)
Beta Blockers   Reason not to administer Beta Blockers:Not Applicable 

## 2013-02-11 NOTE — Telephone Encounter (Addendum)
Message copied by Fredrich Birks on Wed Feb 11, 2013  1:23 PM ------      Message from: Melene Plan      Created: Wed Feb 11, 2013 12:27 PM                   ----- Message -----         From: Marlowe Shores, PA-C         Sent: 02/11/2013  11:56 AM           To: Melene Plan, RN, [facility] Pool            2 week F/U CEA - Fields ------  02/11/13: already scheduled by Kendal Hymen- dpm

## 2013-02-11 NOTE — H&P (Signed)
[facility] [place]  HISTORY AND PHYSICAL  History of Present Illness: Patient is a 67 y.o. 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 The patient had a known history of carotid stenosis. This is been followed with surveillance ultrasound. She was noted on recent ultrasound had bilateral internal carotid occlusions. This apparently occurred asymptomatically. The patient did have a stroke in 1995 which left her with some residual right leg weakness. She has had no further neurologic events since then. She denies any recent symptoms of TIA amaurosis or stroke. She has multiple atherosclerotic risk factors. She is on aspirin. He is on a statin agent.. Other medical problems include hypertension, hyperlipidemia prior history of stroke.  Past Medical History   Diagnosis  Date   .  Hypertension    .  Carotid artery occlusion    .  Hyperlipidemia    .  Stroke     Past Surgical History   Procedure  Laterality  Date   .  Cholecystectomy     Social History  History   Substance Use Topics   .  Smoking status:  Former Smoker     Types:  Cigarettes     Quit date:  03/26/1994   .  Smokeless tobacco:  Not on file   .  Alcohol Use:  No   Family History  No family history on file.  The patient has no history of early vascular disease of family members age less than 50.  Allergies  Allergies   Allergen  Reactions   .  Doxycycline      Rash on hands    Current Outpatient Prescriptions   Medication  Sig  Dispense  Refill   .  amLODipine (NORVASC) 10 MG tablet  Take 10 mg by mouth daily.     Marland Kitchen  aspirin 325 MG tablet  Take 325 mg by mouth daily.     Marland Kitchen  atorvastatin (LIPITOR) 40 MG tablet  Take 40 mg by mouth daily.     Marland Kitchen  lisinopril-hydrochlorothiazide (PRINZIDE,ZESTORETIC) 20-25 MG per tablet  Take 1 tablet by mouth daily.      No current facility-administered medications for this visit.   ROS:  General: No weight loss, Fever,  chills  HEENT: No recent headaches, no nasal bleeding, no visual changes, no sore throat  Neurologic: No dizziness, blackouts, seizures. No recent symptoms of stroke or mini- stroke. No recent episodes of slurred speech, or temporary blindness.  Cardiac: No recent episodes of chest pain/pressure, no shortness of breath at rest. No shortness of breath with exertion. Denies history of atrial fibrillation or irregular heartbeat  Vascular: No history of rest pain in feet. No history of claudication. No history of non-healing ulcer, No history of DVT  Pulmonary: No home oxygen, no productive cough, no hemoptysis, No asthma or wheezing  Musculoskeletal: [ ]  Arthritis, [ ]  Low back pain, [ ]  Joint pain  Hematologic:No history of hypercoagulable state. No history of easy bleeding. No history of anemia  Gastrointestinal: No hematochezia or melena, No gastroesophageal reflux, no trouble swallowing  Urinary: [ ]  chronic Kidney disease, [ ]  on HD - [ ]  MWF or [ ]  TTHS, [ ]  Burning with urination, [ ]  Frequent urination, [ ]  Difficulty urinating;  Skin: No rashes  Psychological: No history of anxiety, No history of depression  Physical Examination  Filed Vitals:   02/11/13 0634  BP: 152/65  Pulse: 85  Temp: 97.3 F (36.3  C)  TempSrc: Oral  Resp: 18  SpO2: 99%    General: Alert and oriented, no acute distress  HEENT: Normal  Neck: Harsh right side carotid bruit, absent left carotid bruit  Pulmonary: Clear to auscultation bilaterally  Cardiac: Regular Rate and Rhythm without murmur  Abdomen: Soft, non-tender, non-distended, no mass  Skin: No rash  Extremity Pulses: 2+ radial, brachial, femoral bilaterally  Musculoskeletal: No deformity or edema  Neurologic: Upper and lower extremity motor 5/5 and symmetric  DATA: occluded left ICA high grade right by angio ASSESSMENT: PLAN: Right CEA today  [facility] [place]  Office: [phone]  Pager: [phone]

## 2013-02-11 NOTE — Anesthesia Postprocedure Evaluation (Signed)
  Anesthesia Post-op Note  Patient: Michele Ellis  Procedure(s) Performed: Procedure(s): ENDARTERECTOMY CAROTID (Right) PATCH ANGIOPLASTY OF RIGHT CAROTID ARTERY  USING HEMASHIELD PLATINUM FINESSE PATCH (Right)  Patient Location: PACU  Anesthesia Type:General  Level of Consciousness: awake, alert  and oriented  Airway and Oxygen Therapy: Patient Spontanous Breathing and Patient connected to nasal cannula oxygen  Post-op Pain: mild  Post-op Assessment: Post-op Vital signs reviewed, Patient's Cardiovascular Status Stable, Respiratory Function Stable, Patent Airway, No signs of Nausea or vomiting and Pain level controlled  Post-op Vital Signs: stable  Complications: No apparent anesthesia complications

## 2013-02-11 NOTE — Op Note (Signed)
Procedure Right carotid endarterectomy  Preoperative diagnosis: High-grade asymptomatic right internal carotid artery stenosis  Postoperative diagnosis: Same  Anesthesia General  Asst.: Lianne Cure [facility] [place]  Operative findings: #1 greater than 90% right internal carotid artery stenosis with calcified plaque  #2 8 French shunt  #3 Dacron patch #4 high bifurcation with approximately 2 cm above the hypoglossal nerve  Operative details: After obtaining informed consent, the patient was taken to the operating room. The patient was placed in a supine position on the operating room table. After induction of general anesthesia and endotracheal intubation a Foley catheter was placed. Next the patient's entire right neck and chest were prepped and draped in usual sterile fashion. An oblique incision was made on the right aspect of the patient's neck anterior to the border the right sternocleidomastoid muscle. The incision was carried down into the subcutaneous tissues and through the platysma. The sternocleidomastoid muscle was identified and reflected laterally. The omohyoid muscle was identified and divided with cautery. The common carotid artery was then found at the base of the incision this was dissected free circumferentially. The vagus nerve was identified and protected. Dissection was then carried up to the level carotid bifurcation. The common facial vein was identified and ligated between silk ties.  The internal jugular vein was then retracted laterally to expose the carotid bifurcation.  It was apparent at this point the the patient had a high carotid bifurcaton. The Ansa cervicalis was identified and traced up to its insertion into the hypoglossal nerve.  The ansa was divided at this level to assist with exposure.  The hyperglossal nerve was identified and protected.  A vessel loop was placed around it and this was used to put traction on the nerve superiorly and inferiorly to assist with exposure.    In order to expose the distal internal carotid artery above the palpable disease I had to do a significant amount of retraction on the hypoglossal nerve.  I also divided the occipital branch of the external carotid artery.  The internal carotid artery was dissected free circumferentially above the level of the hypoglossal nerve and it was soft in character at this location and above any palpable disease. It was tortuous in this area. A vessel loop was placed around the internal carotid artery. Next the external carotid and superior thyroid arteries were dissected free circumferentially and vessel loops were placed around these. The patient was given 8000 units of intravenous heparin. After 2 minutes of circulation time and raising the mean arterial pressure to 90 mm mercury, the distal internal carotid artery was controlled with small bulldog clamp. The external carotid and superior thyroid arteries were controlled with vessel loops. The common carotid artery was controlled peripheral DeBakey clamp. A longitudinal opening was made in the common carotid artery just below the bifurcation. The arteriotomy was extended distally up in the internal carotid with Potts scissors. There was a large calcified plaque with greater than 90% stenosis at the carotid bifurcation which extended several centimenters into the internal carotid artery. The arteriotomy was extended past the level of stenosis.   Next a 10 [place] shunt was brought in the operative field was threaded in the distal internal carotid artery but this had significant resistance in the distal internal presumably due to spasm. Therefore an 8 Fr shunt was brought on the field and this was also snug but did pass. There was sluggish backbleeding from the internal carotid artery This shunt was then threaded into the common carotid artery and secured  with a Rummel tourniquet. Shunt was inspected and found to be free of air. Flow was then restored to the brain after  approximately 8 minutes of ischemia time. A shunt clamp was placed distally to control some ooze from around the shunt.    At this point, attention was then turned to the common carotid artery once again. A suitable endarterectomy plane was obtained and endarterectomy was begun in the common carotid artery and a good proximal endpoint was obtained. An eversion endarterectomy was performed on the external carotid artery and a good endpoint was obtained. The plaque was then elevated in the internal carotid artery and a nice feathered distal endpoint was also obtained which was about 2 cm above the hypoglossal nerve.  The plaque was passed off the table as a specimen. All loose debris was then removed from the carotid bed and everything was thoroughly irrigated with heparinized saline. The Dacron patch was then brought out on to the operative field this was sewn on as a patch angioplasty using running 6-0 Prolene suture.  The distal portion of the patch was difficult to place due to the high location of the arteriotomy so this was fairly tedious.  Prior to completion of the anastomosis the shunt was reoccluded and brought down out of the distal internal carotid artery. The internal carotid artery was thoroughly backbled. This was then controlled again with small bulldog clamp. The shunt was then removed from the proximal common carotid artery and this is again secured with a peripheral DeBakey clamp after forward bleeding. The external carotid was also thoroughly backbled.  The remainder of the patch was completed and the anastomosis was secured. Flow was then restored first retrograde from the external carotid into the carotid bed then antegrade from the common carotid to the external carotid artery and after approximately 5 cardiac cycles to the internal carotid artery. Doppler was used to evaluate the external/internal and common carotid arteries and these all had good Doppler flow. Hemostasis was obtained with a  few additional repair stitches on the medial wall of the artery distally. The heparin was reversed with 80 mg Protamine.   Hemostasis was obtained.  The platysma muscle was reapproximated using a running 3-0 Vicryl suture. The skin was closed with 4 Vicryl subcuticular stitch.  The patient tolerated the procedure well except for the episode of bradycardia.  The patient was awakened in the operating room and was following commands and moving upper extremities symmetrically prior to transfer to the PACU.   Fabienne Bruns, MD [facility] [place] Office: [phone] Pager: [phone]

## 2013-02-11 NOTE — Progress Notes (Signed)
Utilization review completed.  

## 2013-02-12 LAB — CBC
HCT: 31.5 % — ABNORMAL LOW (ref 36.0–46.0)
Hemoglobin: 10.4 g/dL — ABNORMAL LOW (ref 12.0–15.0)
MCH: 28.9 pg (ref 26.0–34.0)
MCHC: 33 g/dL (ref 30.0–36.0)
MCV: 87.5 fL (ref 78.0–100.0)
RBC: 3.6 MIL/uL — ABNORMAL LOW (ref 3.87–5.11)
WBC: 8 10*3/uL (ref 4.0–10.5)

## 2013-02-12 LAB — BASIC METABOLIC PANEL
BUN: 16 mg/dL (ref 6–23)
CO2: 24 mEq/L (ref 19–32)
Chloride: 101 mEq/L (ref 96–112)
GFR calc non Af Amer: 73 mL/min — ABNORMAL LOW (ref >90)
Glucose, Bld: 135 mg/dL — ABNORMAL HIGH (ref 70–99)
Potassium: 3.7 mEq/L (ref 3.5–5.1)
Sodium: 136 mEq/L (ref 135–145)

## 2013-02-12 MED ORDER — OXYCODONE-ACETAMINOPHEN 5-325 MG PO TABS: 1.0000 | ORAL_TABLET | Freq: Four times a day (QID) | ORAL | Status: DC | PRN

## 2013-02-12 NOTE — Care Management Note (Signed)
    Page 1 of 1   02/12/2013     11:27:11 AM   CARE MANAGEMENT NOTE 02/12/2013  Patient:  Michele Ellis, Michele Ellis   Account Number:  [id number]  Date Initiated:  02/12/2013  Documentation initiated by:  Donn Pierini  Subjective/Objective Assessment:   Pt admitted s/p CEA     Action/Plan:   PTA pt lived at home- plan is for d/c back home today- request for a RW for home- order in chart- [facility] notified of DME needs and to deliver RW to room prior to d/c   Anticipated DC Date:  02/12/2013   Anticipated DC Plan:  HOME/SELF CARE      DC Planning Services  CM consult      PAC Choice  DURABLE MEDICAL EQUIPMENT   Choice offered to / List presented to:     DME arranged  Levan Hurst      DME agency  [facility]        Status of service:  Completed, signed off Medicare Important Message given?   (If response is "NO", the following Medicare IM given date fields will be blank) Date Medicare IM given:   Date Additional Medicare IM given:    Discharge Disposition:    Per UR Regulation:  Reviewed for med. necessity/level of care/duration of stay  If discussed at Long Length of Stay Meetings, dates discussed:    Comments:

## 2013-02-12 NOTE — Progress Notes (Addendum)
[facility] [place]  Subjective  - Doing well.  She is ambulating with rolling walker and has voided.   Objective 128/56 92 98.6 F (37 C) (Oral) 22 100%  Intake/Output Summary (Last 24 hours) at 02/12/13 0746 Last data filed at 02/12/13 0600  Gross per 24 hour  Intake   2500 ml  Output   1225 ml  Net   1275 ml    Left neck incision clean, dry and intact. No facial droop, positive tongue deviation. Grip 5/5 equal bilateral. Swallowing liquids well Speech is fluent. Right ankle decreased range of motion and weakness inversion and eversion.  Assessment/Planning: Procedure(s): ENDARTERECTOMY CAROTID PATCH ANGIOPLASTY OF RIGHT CAROTID ARTERY  USING HEMASHIELD PLATINUM FINESSE PATCH  1 Day Post-OpSurgeon(s): Sherren Kerns, MD  D/C home f/u in 2 weeks with Dr. Darrick Penna. Rolling walker for home pending   [facility]inton Gallant [facility] 02/12/2013  Agree with above.  Neuro intact with exception of mild tongue deviation. Not unexpected in light of traction on nerve. Will d/c home today 7:46 AM --  Laboratory Lab Results:  Recent Labs  02/12/13 0450  WBC 8.0  HGB 10.4*  HCT 31.5*  PLT 187   BMET  Recent Labs  02/12/13 0450  NA 136  K 3.7  CL 101  CO2 24  GLUCOSE 135*  BUN 16  CREATININE 0.81  CALCIUM 8.6    COAG Lab Results  Component Value Date   INR 0.88 02/06/2013   No results found for this basename: PTT

## 2013-02-12 NOTE — Discharge Summary (Signed)
[facility] Discharge Summary   Patient ID:  Michele Ellis MRN: [id number] DOB/AGE: 12/02/1945 67 y.o.  Admit date: 02/11/2013 Discharge date: 02/12/2013 Date of Surgery: 02/11/2013 Surgeon: Surgeon(s): Sherren Kerns, MD  Admission Diagnosis: RIGHT ICA STENOSIS  Discharge Diagnoses:  RIGHT ICA STENOSIS  Secondary Diagnoses: Past Medical History  Diagnosis Date  . Hypertension   . Carotid artery occlusion   . Hyperlipidemia   . Stroke     1990  below knee on right--balance is off    Procedure(s): ENDARTERECTOMY CAROTID PATCH ANGIOPLASTY OF RIGHT CAROTID ARTERY  USING HEMASHIELD PLATINUM FINESSE PATCH  Discharged Condition: good  HPI: Patient is a 67 y.o. 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 The patient had a known history of carotid stenosis. This is been followed with surveillance ultrasound. She was noted on recent ultrasound had bilateral internal carotid occlusions. This apparently occurred asymptomatically. The patient did have a stroke in 1995 which left her with some residual right leg weakness. She has had no further neurologic events since then. She denies any recent symptoms of TIA amaurosis or stroke. She has multiple atherosclerotic risk factors. She is on aspirin. She is on a statin agent.. Other medical problems include hypertension, hyperlipidemia prior history of stroke.   She underwent right carotid endarterectomy 02/11/2013 by Dr. Fabienne Ellis.  She had an uneventful night in the stepdown ICU.  She is walker with the aid of a rolling walker, taking PO liquids well, and voided independently.  She will be discharges home today.  I have ordered a rolling walker for safe ambulation.    Hospital Course:  Michele Ellis is a 67 y.o. female is S/P Right Procedure(s): ENDARTERECTOMY CAROTID PATCH ANGIOPLASTY OF RIGHT CAROTID ARTERY  USING HEMASHIELD PLATINUM FINESSE  PATCH Extubated: POD # 0 Physical exam: Left neck incision clean, dry and intact.  No facial droop, positive tongue deviation.  Grip 5/5 equal bilateral.  Swallowing liquids well  Speech is fluent.  Right ankle decreased range of motion and weakness inversion and eversion.  Pt. Ambulating, voiding and taking PO diet without difficulty. Pt pain controlled with PO pain meds. Labs as below Complications:none  Consults:     Significant Diagnostic Studies: CBC Lab Results  Component Value Date   WBC 8.0 02/12/2013   HGB 10.4* 02/12/2013   HCT 31.5* 02/12/2013   MCV 87.5 02/12/2013   PLT 187 02/12/2013    BMET    Component Value Date/Time   NA 136 02/12/2013 0450   K 3.7 02/12/2013 0450   CL 101 02/12/2013 0450   CO2 24 02/12/2013 0450   GLUCOSE 135* 02/12/2013 0450   BUN 16 02/12/2013 0450   CREATININE 0.81 02/12/2013 0450   CALCIUM 8.6 02/12/2013 0450   GFRNONAA 73* 02/12/2013 0450   GFRAA 85* 02/12/2013 0450   COAG Lab Results  Component Value Date   INR 0.88 02/06/2013     Disposition:  Discharge to :Home Discharge Orders   Future Appointments Provider Department Dept Phone   02/26/2013 10:45 AM Sherren Kerns, MD [facility] [phone]   Future Orders Complete By Expires   Call MD for:  redness, tenderness, or signs of infection (pain, swelling, bleeding, redness, odor or green/yellow discharge around incision site)  As directed    Call MD for:  severe or increased pain, loss or decreased feeling  in affected limb(s)  As directed    Call MD for:  temperature >100.5  As directed    CAROTID Sugery: Call MD for difficulty swallowing or speaking; weakness in arms or legs that is a new symtom; severe headache.  If you have increased swelling in the neck and/or  are having difficulty breathing, CALL 911  As directed    Discharge patient  As directed    Comments:     Discharge pt to home   Driving Restrictions  As directed     Comments:     No driving   Increase activity slowly  As directed    Comments:     Walk with assistance use walker or cane as needed   Lifting restrictions  As directed    Comments:     No lifting for 4 weeks   May shower   As directed    Scheduling Instructions:     Friday   may wash over wound with mild soap and water  As directed    No dressing needed  As directed    Resume previous diet  As directed        Medication List         amLODipine 10 MG tablet  Commonly known as:  NORVASC  Take 10 mg by mouth daily.     aspirin 325 MG tablet  Take 325 mg by mouth daily.     lisinopril-hydrochlorothiazide 20-25 MG per tablet  Commonly known as:  PRINZIDE,ZESTORETIC  Take 1 tablet by mouth daily.     mupirocin ointment 2 %  Commonly known as:  BACTROBAN  1 Application, nasally, 2 times daily for 5 days prior to surgery     oxyCODONE-acetaminophen 5-325 MG per tablet  Commonly known as:  PERCOCET/ROXICET  Take 1 tablet by mouth every 6 (six) hours as needed for moderate pain.     SYSTANE 0.4-0.3 % Soln  Generic drug:  Polyethyl Glycol-Propyl Glycol  Apply 1-2 drops to eye daily as needed (for dry eyes).       Verbal and written Discharge instructions given to the patient. Wound care per Discharge AVS     Follow-up Information   Follow up with Sherren Kerns, MD In 2 weeks. (office will arrange-sent)    Specialty:  Vascular Surgery   Contact information:   [address] [place] [address] [phone]       Signed: Clinton Gallant [facility] 02/12/2013, 8:03 AM  --- For VQI Registry use --- Instructions: Press F2 to tab through selections.  Delete question if not applicable.   Modified Rankin score at D/C (0-6): Rankin Score=1  IV medication needed for:  1. Hypertension: No 2. Hypotension: No  Post-op Complications: No  1. Post-op CVA or TIA: No  If yes: Event classification (right eye, left eye, right cortical, left cortical, verterobasilar, other):    If yes: Timing of event (intra-op, <6 hrs post-op, >=6 hrs post-op, unknown):   2. CN injury: No  If yes: CN  injuried   3. Myocardial infarction: No  If yes: Dx by (EKG or clinical, Troponin):   4.  CHF: No  5.  Dysrhythmia (new): No  6. Wound infection: No  7. Reperfusion symptoms: No  8. Return to OR: No  If yes: return to OR for (bleeding, neurologic, other CEA incision, other):   Discharge medications: Statin use:  No  for medical reason   ASA use:  Yes Beta blocker use:  No  for medical reason   ACE-Inhibitor use:  Yes P2Y12 Antagonist use: [x ]  None, [ ]  Plavix, [ ]  Plasugrel, [ ]  Ticlopinine, [ ]  Ticagrelor, [ ]  Other, [ ]  No for medical reason, [ ]  Non-compliant, [ ]  Not-indicated Anti-coagulant use:  [x ] None, [ ]  Warfarin, [ ]  Rivaroxaban, [ ]  Dabigatran, [ ]  Other, [ ]  No for medical reason, [ ]  Non-compliant, [ ]  Not-indicated

## 2013-02-13 ENCOUNTER — Encounter (HOSPITAL_COMMUNITY): Payer: Self-pay | Admitting: Vascular Surgery

## 2013-02-25 ENCOUNTER — Encounter: Payer: Self-pay | Admitting: Vascular Surgery

## 2013-02-26 ENCOUNTER — Ambulatory Visit (INDEPENDENT_AMBULATORY_CARE_PROVIDER_SITE_OTHER): Payer: Self-pay | Admitting: Vascular Surgery

## 2013-02-26 ENCOUNTER — Encounter: Payer: Self-pay | Admitting: Vascular Surgery

## 2013-02-26 DIAGNOSIS — I6529 Occlusion and stenosis of unspecified carotid artery: Secondary | ICD-10-CM

## 2013-02-26 DIAGNOSIS — Z48812 Encounter for surgical aftercare following surgery on the circulatory system: Secondary | ICD-10-CM

## 2013-02-26 NOTE — Progress Notes (Addendum)
[facility] [place] HISTORY AND PHYSICAL    History of Present Illness:  Patient is a 67 y.o. year old female who presents for post-operative follow-up after right carotid endarterectomy.  Denies headaches, numbness, tingling or other neuro deficits.  No swallowing problems.  No incisional drainage.  She is taking one aspirin daily. Her blood pressure has been slightly lower since discharge from the hospital.   Physical Examination  Filed Vitals:   02/26/13 1058  BP: 124/59  Pulse:     Body mass index is 30.72 kg/(m^2).  General:  Alert and oriented, no acute distress Neck: No bruit or JVD Skin: No rash Neurologic: Upper and lower extremity motor 5/5 and symmetric, tongue midline  ASSESSMENT: Doing well status post right carotid endarterectomy. She has a known chronic left internal carotid artery occlusion.   PLAN:  Followup office visit in carotid duplex scan in 6 months. Continue aspirin daily. She will start making a log for blood pressure. She'll measure this once daily. She will hold her blood pressure medication if her blood pressure is less than 100 systolic. She'll continue to follow with Dr. Dimas Aguas regarding this.   Fabienne Bruns, MD [facility] [place] Office: [phone] Pager: [phone]

## 2013-02-26 NOTE — Addendum Note (Signed)
Addended by: Adria Dill L on: 02/26/2013 03:43 PM   Modules accepted: Orders

## 2013-09-03 ENCOUNTER — Other Ambulatory Visit (HOSPITAL_COMMUNITY): Payer: Medicare Other

## 2013-09-03 ENCOUNTER — Ambulatory Visit: Payer: Medicare Other | Admitting: Family

## 2013-10-23 ENCOUNTER — Other Ambulatory Visit (HOSPITAL_COMMUNITY): Payer: Medicare Other

## 2013-10-23 ENCOUNTER — Ambulatory Visit: Payer: Medicare Other | Admitting: Family

## 2013-12-10 ENCOUNTER — Encounter: Payer: Self-pay | Admitting: Family

## 2013-12-11 ENCOUNTER — Ambulatory Visit (HOSPITAL_COMMUNITY)
Admission: RE | Admit: 2013-12-11 | Discharge: 2013-12-11 | Disposition: A | Discharge status: Home / Self Care | Payer: Medicare Other | Source: Ambulatory Visit | Attending: Family | Admitting: Family

## 2013-12-11 ENCOUNTER — Ambulatory Visit (INDEPENDENT_AMBULATORY_CARE_PROVIDER_SITE_OTHER): Payer: Medicare Other | Admitting: Family

## 2013-12-11 ENCOUNTER — Encounter: Payer: Self-pay | Admitting: Family

## 2013-12-11 VITALS — BP 131/72 | HR 61 | Resp 16 | Ht 61.0 in | Wt 160.0 lb

## 2013-12-11 DIAGNOSIS — I10 Essential (primary) hypertension: Secondary | ICD-10-CM | POA: Diagnosis not present

## 2013-12-11 DIAGNOSIS — Z7982 Long term (current) use of aspirin: Secondary | ICD-10-CM | POA: Insufficient documentation

## 2013-12-11 DIAGNOSIS — Z9889 Other specified postprocedural states: Secondary | ICD-10-CM | POA: Insufficient documentation

## 2013-12-11 DIAGNOSIS — Z48812 Encounter for surgical aftercare following surgery on the circulatory system: Secondary | ICD-10-CM | POA: Insufficient documentation

## 2013-12-11 DIAGNOSIS — E785 Hyperlipidemia, unspecified: Secondary | ICD-10-CM | POA: Diagnosis not present

## 2013-12-11 DIAGNOSIS — Z8673 Personal history of transient ischemic attack (TIA), and cerebral infarction without residual deficits: Secondary | ICD-10-CM | POA: Insufficient documentation

## 2013-12-11 DIAGNOSIS — Z87891 Personal history of nicotine dependence: Secondary | ICD-10-CM | POA: Diagnosis not present

## 2013-12-11 DIAGNOSIS — I6529 Occlusion and stenosis of unspecified carotid artery: Secondary | ICD-10-CM | POA: Insufficient documentation

## 2013-12-11 NOTE — Progress Notes (Signed)
Established Carotid Patient   History of Present Illness  Michele Ellis is a 68 y.o. female patient of  Dr. Oneida Alar who is s/p right carotid endarterectomy on 02/11/2013 for greater than 90% right internal carotid artery stenosis and calcified plaque, and has a known left internal carotid artery occlusion. She returns today for follow up.  Patient has Positive history of TIA or stroke symptom about 1990 as manifested by right "drop foot", pt denies any further stroke or TIA activity since then.  The patient denies amaurosis fugax or monocular blindness.  The patient  denies facial drooping.  Pt. denies right arm hemiplegia.  The patient denies receptive or expressive aphasia.  She still has weakness in her right lower leg, uses a cane to walk.  Pt denies New Medical or Surgical History.  Pt Diabetic: No Pt smoker: former smoker, quit in 1996  Pt meds include: Statin : Yes ASA: Yes Other anticoagulants/antiplatelets: no   Past Medical History  Diagnosis Date  . Hypertension   . Carotid artery occlusion   . Hyperlipidemia   . Stroke     1990  below knee on right--balance is off    Social History History  Substance Use Topics  . Smoking status: Former Smoker -- 1.00 packs/day for 25 years    Types: Cigarettes    Quit date: 03/26/1994  . Smokeless tobacco: Never Used  . Alcohol Use: No    Family History Family History  Problem Relation Age of Onset  . Cancer Father     Lung- Black Lung    Surgical History Past Surgical History  Procedure Laterality Date  . Cholecystectomy    . Rectal abscess      perirectal  . Right arm      SURGERY, ALSO HAS METAL PLATE  . Fracture surgery      RT arm  . Endarterectomy Right 02/11/2013    Procedure: ENDARTERECTOMY CAROTID;  Surgeon: Elam Dutch, MD;  Location: [facility] OR;  Service: Vascular;  Laterality: Right;  . Patch angioplasty Right 02/11/2013    Procedure: PATCH ANGIOPLASTY OF RIGHT CAROTID ARTERY  USING HEMASHIELD  PLATINUM FINESSE PATCH;  Surgeon: Elam Dutch, MD;  Location: [facility];  Service: Vascular;  Laterality: Right;  . Carotid endarterectomy Right 02-11-13    CE    Allergies  Allergen Reactions  . Doxycycline Rash    Rash on hands and feet.    Current Outpatient Prescriptions  Medication Sig Dispense Refill  . amLODipine (NORVASC) 10 MG tablet Take 10 mg by mouth daily.      Marland Kitchen aspirin 325 MG tablet Take 325 mg by mouth daily.      Marland Kitchen lisinopril-hydrochlorothiazide (PRINZIDE,ZESTORETIC) 20-25 MG per tablet Take 1 tablet by mouth daily.      . mupirocin ointment (BACTROBAN) 2 % 1 Application, nasally, 2 times daily for 5 days prior to surgery  22 g  0  . rosuvastatin (CRESTOR) 5 MG tablet Take 5 mg by mouth daily.      Marland Kitchen oxyCODONE-acetaminophen (PERCOCET/ROXICET) 5-325 MG per tablet Take 1 tablet by mouth every 6 (six) hours as needed for moderate pain.  30 tablet  0  . Polyethyl Glycol-Propyl Glycol (SYSTANE) 0.4-0.3 % SOLN Apply 1-2 drops to eye daily as needed (for dry eyes).       No current facility-administered medications for this visit.    Review of Systems : See HPI for pertinent positives and negatives.  Physical Examination  Filed Vitals:   12/11/13  1147 12/11/13 1150  BP: 135/63 131/72  Pulse: 64 61  Resp:  16  Height:  5\' 1"  (1.549 m)  Weight:  160 lb (72.576 kg)  SpO2:  100%   Body mass index is 30.25 kg/(m^2).  General: WDWN obese female in NAD GAIT: normal Eyes: PERRLA Pulmonary:  Non-labored, CTAB, Negative  Rales, Negative rhonchi, & Negative wheezing.  Cardiac: regular Rhythm ,  Negative detected murmur.  VASCULAR EXAM Carotid Bruits Right Left   Positive Positive    Aorta is not palpable. Radial pulses are 2+ palpable and equal.                                                                                                                    Gastrointestinal: soft, nontender, BS WNL, no r/g,  negative masses palpated.  Musculoskeletal: Negative  muscle atrophy/wasting. M/S 5/5 throughout, Extremities without ischemic changes, no peripheral edema  Neurologic: A&O X 3; Appropriate Affect ; SENSATION ;normal;  Speech is normal CN 2-12 intact, Pain and light touch intact in extremities, Motor exam as listed above.   Non-Invasive Vascular Imaging CAROTID DUPLEX 12/11/2013 CEREBROVASCULAR DUPLEX EVALUATION    INDICATION: Carotid artery disease     PREVIOUS INTERVENTION(S): Right carotid endarterectomy 02/11/2013. Known left internal carotid artery occlusion.    DUPLEX EXAM:     RIGHT  LEFT  Peak Systolic Velocities (cm/s) End Diastolic Velocities (cm/s) Plaque LOCATION Peak Systolic Velocities (cm/s) End Diastolic Velocities (cm/s) Plaque  174 17 HT CCA PROXIMAL 114 17 HT  97 16  CCA MID 160 30 HT  112 24 HT CCA DISTAL 162 30 HT  142 18  ECA 248 46 HT  117 20  ICA PROXIMAL Occluded    117 29  ICA MID Occluded    92 22  ICA DISTAL Occluded        ICA / CCA Ratio (PSV)   Antegrade  Vertebral Flow Antegrade   384 Brachial Systolic Pressure (mmHg) 665  Triphasic  Brachial Artery Waveforms Triphasic     Plaque Morphology:  HM = Homogeneous, HT = Heterogeneous, CP = Calcific Plaque, SP = Smooth Plaque, IP = Irregular Plaque     ADDITIONAL FINDINGS:     IMPRESSION: Patent right carotid endarterectomy site with no evidence of hyperplasia or restenosis.  Known occlusion of the left internal carotid artery. Left external carotid artery stenosis.    Compared to the previous exam:  No prior exam performed at this facility for comparison.     Assessment: Michele Ellis is a 68 y.o. female who is s/p right carotid endarterectomy on 02/11/2013 for greater than 90% right internal carotid artery stenosis and calcified plaque, and has a known left internal carotid artery occlusion. She reports no stroke or TIA activity since about 33. Today's carotid Duplex reveals a patent right carotid endarterectomy site with no evidence of  hyperplasia or restenosis and known occlusion of the left internal carotid artery.  Plan: Follow-up in 1 year with Carotid Duplex scan.  I discussed in depth with the patient the nature of atherosclerosis, and emphasized the importance of maximal medical management including strict control of blood pressure, blood glucose, and lipid levels, obtaining regular exercise, and continued cessation of smoking.  The patient is aware that without maximal medical management the underlying atherosclerotic disease process will progress, limiting the benefit of any interventions. The patient was given information about stroke prevention and what symptoms should prompt the patient to seek immediate medical care. Thank you for allowing [place] to participate in this patient's care.  Clemon Chambers, RN, MSN, FNP-C [facility] Office: [phone]  Clinic Physician: Bridgett Larsson  12/11/2013 12:01 PM

## 2013-12-11 NOTE — Patient Instructions (Signed)
Stroke Prevention Some medical conditions and behaviors are associated with an increased chance of having a stroke. You may prevent a stroke by making healthy choices and managing medical conditions. HOW CAN I REDUCE MY RISK OF HAVING A STROKE?   Stay physically active. Get at least 30 minutes of activity on most or all days.  Do not smoke. It may also be helpful to avoid exposure to secondhand smoke.  Limit alcohol use. Moderate alcohol use is considered to be:  No more than 2 drinks per day for men.  No more than 1 drink per day for nonpregnant women.  Eat healthy foods. This involves:  Eating 5 or more servings of fruits and vegetables a day.  Making dietary changes that address high blood pressure (hypertension), high cholesterol, diabetes, or obesity.  Manage your cholesterol levels.  Making food choices that are high in fiber and low in saturated fat, trans fat, and cholesterol may control cholesterol levels.  Take any prescribed medicines to control cholesterol as directed by your health care provider.  Manage your diabetes.  Controlling your carbohydrate and sugar intake is recommended to manage diabetes.  Take any prescribed medicines to control diabetes as directed by your health care provider.  Control your hypertension.  Making food choices that are low in salt (sodium), saturated fat, trans fat, and cholesterol is recommended to manage hypertension.  Take any prescribed medicines to control hypertension as directed by your health care provider.  Maintain a healthy weight.  Reducing calorie intake and making food choices that are low in sodium, saturated fat, trans fat, and cholesterol are recommended to manage weight.  Stop drug abuse.  Avoid taking birth control pills.  Talk to your health care provider about the risks of taking birth control pills if you are over 35 years old, smoke, get migraines, or have ever had a blood clot.  Get evaluated for sleep  disorders (sleep apnea).  Talk to your health care provider about getting a sleep evaluation if you snore a lot or have excessive sleepiness.  Take medicines only as directed by your health care provider.  For some people, aspirin or blood thinners (anticoagulants) are helpful in reducing the risk of forming abnormal blood clots that can lead to stroke. If you have the irregular heart rhythm of atrial fibrillation, you should be on a blood thinner unless there is a good reason you cannot take them.  Understand all your medicine instructions.  Make sure that other conditions (such as anemia or atherosclerosis) are addressed. SEEK IMMEDIATE MEDICAL CARE IF:   You have sudden weakness or numbness of the face, arm, or leg, especially on one side of the body.  Your face or eyelid droops to one side.  You have sudden confusion.  You have trouble speaking (aphasia) or understanding.  You have sudden trouble seeing in one or both eyes.  You have sudden trouble walking.  You have dizziness.  You have a loss of balance or coordination.  You have a sudden, severe headache with no known cause.  You have new chest pain or an irregular heartbeat. Any of these symptoms may represent a serious problem that is an emergency. Do not wait to see if the symptoms will go away. Get medical help at once. Call your local emergency services (911 in [place] Do not drive yourself to the hospital. Document Released: 04/19/2004 Document Revised: 07/27/2013 Document Reviewed: 09/12/2012 [employer] Patient Information 2015 [employer]. This information is not intended to replace advice given   to you by your health care provider. Make sure you discuss any questions you have with your health care provider.  

## 2014-03-04 ENCOUNTER — Encounter (HOSPITAL_COMMUNITY): Payer: Self-pay | Admitting: Vascular Surgery

## 2014-09-28 ENCOUNTER — Encounter: Payer: Self-pay | Admitting: Cardiology

## 2014-12-08 ENCOUNTER — Encounter: Payer: Self-pay | Admitting: Family

## 2014-12-10 ENCOUNTER — Ambulatory Visit (HOSPITAL_COMMUNITY)
Admission: RE | Admit: 2014-12-10 | Discharge: 2014-12-10 | Disposition: A | Discharge status: Home / Self Care | Payer: Medicare Other | Source: Ambulatory Visit | Attending: Family | Admitting: Family

## 2014-12-10 ENCOUNTER — Encounter: Payer: Self-pay | Admitting: Family

## 2014-12-10 ENCOUNTER — Ambulatory Visit (INDEPENDENT_AMBULATORY_CARE_PROVIDER_SITE_OTHER): Payer: Medicare Other | Admitting: Family

## 2014-12-10 VITALS — BP 145/79 | HR 69 | Temp 97.7°F | Resp 14 | Ht 62.0 in | Wt 174.0 lb

## 2014-12-10 DIAGNOSIS — Z48812 Encounter for surgical aftercare following surgery on the circulatory system: Secondary | ICD-10-CM

## 2014-12-10 DIAGNOSIS — I6522 Occlusion and stenosis of left carotid artery: Secondary | ICD-10-CM | POA: Diagnosis not present

## 2014-12-10 DIAGNOSIS — I6523 Occlusion and stenosis of bilateral carotid arteries: Secondary | ICD-10-CM

## 2014-12-10 DIAGNOSIS — Z87891 Personal history of nicotine dependence: Secondary | ICD-10-CM

## 2014-12-10 DIAGNOSIS — Z9889 Other specified postprocedural states: Secondary | ICD-10-CM | POA: Diagnosis not present

## 2014-12-10 NOTE — Progress Notes (Signed)
Established Carotid Patient   History of Present Illness  Michele Ellis is a 69 y.o. female patient of Dr. Oneida Alar who is s/p right carotid endarterectomy on 02/11/2013 for greater than 90% right internal carotid artery stenosis and calcified plaque, and has a known left internal carotid artery occlusion. She returns today for follow up.  Patient has positive history of TIA symptom about 1990 as manifested by right "drop foot", pt denies any further stroke or TIA activity since then. The patient denies amaurosis fugax or monocular blindness. The patient denies unilateral facial drooping. Pt. denies right arm hemiplegia. The patient denies receptive or expressive aphasia. She still has mild weakness in her right lower leg.  Pt denies New Medical or Surgical History.  Pt Diabetic: No Pt smoker: former smoker, quit in 1996  Pt meds include: Statin : no, states it was too expensive, she stopped taking about December 2015, states she has not informed her PCP. I advised pt to notify her PCP ASAP re this. ASA: Yes Other anticoagulants/antiplatelets: no   Past Medical History  Diagnosis Date  . Hypertension   . Carotid artery occlusion   . Hyperlipidemia   . Stroke     1990  below knee on right--balance is off    Social History Social History  Substance Use Topics  . Smoking status: Former Smoker -- 1.00 packs/day for 25 years    Types: Cigarettes    Quit date: 03/26/1994  . Smokeless tobacco: Never Used  . Alcohol Use: No    Family History Family History  Problem Relation Age of Onset  . Cancer Father     Lung- Black Lung    Surgical History Past Surgical History  Procedure Laterality Date  . Cholecystectomy    . Rectal abscess      perirectal  . Right arm      SURGERY, ALSO HAS METAL PLATE  . Fracture surgery      RT arm  . Endarterectomy Right 02/11/2013    Procedure: ENDARTERECTOMY CAROTID;  Surgeon: Elam Dutch, MD;  Location: [facility] OR;  Service:  Vascular;  Laterality: Right;  . Patch angioplasty Right 02/11/2013    Procedure: PATCH ANGIOPLASTY OF RIGHT CAROTID ARTERY  USING HEMASHIELD PLATINUM FINESSE PATCH;  Surgeon: Elam Dutch, MD;  Location: [place];  Service: Vascular;  Laterality: Right;  . Carotid endarterectomy Right 02-11-13    CE  . Carotid angiogram N/A 01/02/2013    Procedure: CAROTID ANGIOGRAM;  Surgeon: Elam Dutch, MD;  Location: [facility] CATH LAB;  Service: Cardiovascular;  Laterality: N/A;    Allergies  Allergen Reactions  . Doxycycline Rash    Rash on hands and feet.    Current Outpatient Prescriptions  Medication Sig Dispense Refill  . amLODipine (NORVASC) 10 MG tablet Take 10 mg by mouth daily.    Marland Kitchen aspirin 325 MG tablet Take 325 mg by mouth daily.    Marland Kitchen lisinopril-hydrochlorothiazide (PRINZIDE,ZESTORETIC) 20-25 MG per tablet Take 1 tablet by mouth daily.    Vladimir Faster Glycol-Propyl Glycol (SYSTANE) 0.4-0.3 % SOLN Apply 1-2 drops to eye daily as needed (for dry eyes).    . mupirocin ointment (BACTROBAN) 2 % 1 Application, nasally, 2 times daily for 5 days prior to surgery (Patient not taking: Reported on 12/10/2014) 22 g 0  . oxyCODONE-acetaminophen (PERCOCET/ROXICET) 5-325 MG per tablet Take 1 tablet by mouth every 6 (six) hours as needed for moderate pain. (Patient not taking: Reported on 12/10/2014) 30 tablet 0  . rosuvastatin (  CRESTOR) 5 MG tablet Take 5 mg by mouth daily.     No current facility-administered medications for this visit.    Review of Systems : See HPI for pertinent positives and negatives.  Physical Examination Filed Vitals:   12/10/14 1553 12/10/14 1558 12/10/14 1559  BP: 143/72 143/79 145/79  Pulse: 69 69 69  Temp: 97.7 F (36.5 C)    Resp: 14    Height: 5\' 2"  (1.575 m)    Weight: 174 lb (78.926 kg)    SpO2: 100%     Body mass index is 31.82 kg/(m^2).   General: WDWN obese female in NAD GAIT: normal Eyes: PERRLA Pulmonary: Non-labored, CTAB, no rales, no rhonchi, & no  wheezing.  Cardiac: regular Rhythm, no detected murmur.  VASCULAR EXAM Carotid Bruits Right Left   Positive Positive   Aorta is not palpable. Radial pulses are 2+ palpable and equal.     Gastrointestinal: soft, nontender, BS WNL, no r/g, no palpable masses.  Musculoskeletal: Negative muscle atrophy/wasting. M/S 5/5 throughout, Extremities without ischemic changes, no peripheral edema  Neurologic: A&O X 3; Appropriate Affect,  Speech is normal CN 2-12 intact, Pain and light touch intact in extremities, Motor exam as listed above.         Non-Invasive Vascular Imaging CAROTID DUPLEX 12/03/14   CEREBROVASCULAR DUPLEX EVALUATION    INDICATION: Carotid artery disease     PREVIOUS INTERVENTION(S):     DUPLEX EXAM:     RIGHT  LEFT  Peak Systolic Velocities (cm/s) End Diastolic Velocities (cm/s) Plaque LOCATION Peak Systolic Velocities (cm/s) End Diastolic Velocities (cm/s) Plaque  76 15  CCA PROXIMAL 173 28 HM  123 24 HM CCA MID 145 26 HM  74 15 HT CCA DISTAL 119 19   50 6  ECA 58 4   26 8   ICA PROXIMAL 23 8   101 34  ICA MID 76 29   60 19  ICA DISTAL 66 25     0.82 ICA / CCA Ratio (PSV) 0.52  Antegrade  Vertebral Flow Not Visualized   937 Brachial Systolic Pressure (mmHg) 902  Multiphasic (Subclavian artery) Brachial Artery Waveforms Multiphasic (Subclavian artery)    Plaque Morphology:  HM = Homogeneous, HT = Heterogeneous, CP = Calcific Plaque, SP = Smooth Plaque, IP = Irregular Plaque     ADDITIONAL FINDINGS:     IMPRESSION: Bilateral internal carotid artery velocities suggest a <40% stenosis.     Compared to the previous exam:  No significant change in comparison to the last exam.      Assessment: Michele Ellis is a 69 y.o. female who is s/p right carotid endarterectomy on 02/11/2013 for greater than 90% right internal carotid artery  stenosis and calcified plaque, and has a known left internal carotid artery occlusion. She has had no stroke or TIA activity since 1990, has mild residual right leg weakness. Today's carotid duplex suggests minimal bilateral ICA stenoses. No significant change in comparison to the last exam on 12/11/13.    Plan: Follow-up in 1 year with Carotid Duplex   I discussed in depth with the patient the nature of atherosclerosis, and emphasized the importance of maximal medical management including strict control of blood pressure, blood glucose, and lipid levels, obtaining regular exercise, and continued cessation of smoking.  The patient is aware that without maximal medical management the underlying atherosclerotic disease process will progress, limiting the benefit of any interventions. The patient was given information about stroke prevention and what symptoms should prompt  the patient to seek immediate medical care. Thank you for allowing [place] to participate in this patient's care.  Clemon Chambers, RN, MSN, FNP-C [facility] Office: [phone]  Clinic Physician: Bridgett Larsson  12/10/2014 3:56 PM

## 2014-12-10 NOTE — Progress Notes (Signed)
Filed Vitals:   12/10/14 1553 12/10/14 1558 12/10/14 1559  BP: 143/72 143/79 145/79  Pulse: 69 69 69  Temp: 97.7 F (36.5 C)    Resp: 14    Height: 5\' 2"  (1.575 m)    Weight: 174 lb (78.926 kg)    SpO2: 100%

## 2014-12-10 NOTE — Patient Instructions (Signed)
Stroke Prevention Some medical conditions and behaviors are associated with an increased chance of having a stroke. You may prevent a stroke by making healthy choices and managing medical conditions. HOW CAN I REDUCE MY RISK OF HAVING A STROKE?   Stay physically active. Get at least 30 minutes of activity on most or all days.  Do not smoke. It may also be helpful to avoid exposure to secondhand smoke.  Limit alcohol use. Moderate alcohol use is considered to be:  No more than 2 drinks per day for men.  No more than 1 drink per day for nonpregnant women.  Eat healthy foods. This involves:  Eating 5 or more servings of fruits and vegetables a day.  Making dietary changes that address high blood pressure (hypertension), high cholesterol, diabetes, or obesity.  Manage your cholesterol levels.  Making food choices that are high in fiber and low in saturated fat, trans fat, and cholesterol may control cholesterol levels.  Take any prescribed medicines to control cholesterol as directed by your health care provider.  Manage your diabetes.  Controlling your carbohydrate and sugar intake is recommended to manage diabetes.  Take any prescribed medicines to control diabetes as directed by your health care provider.  Control your hypertension.  Making food choices that are low in salt (sodium), saturated fat, trans fat, and cholesterol is recommended to manage hypertension.  Take any prescribed medicines to control hypertension as directed by your health care provider.  Maintain a healthy weight.  Reducing calorie intake and making food choices that are low in sodium, saturated fat, trans fat, and cholesterol are recommended to manage weight.  Stop drug abuse.  Avoid taking birth control pills.  Talk to your health care provider about the risks of taking birth control pills if you are over 35 years old, smoke, get migraines, or have ever had a blood clot.  Get evaluated for sleep  disorders (sleep apnea).  Talk to your health care provider about getting a sleep evaluation if you snore a lot or have excessive sleepiness.  Take medicines only as directed by your health care provider.  For some people, aspirin or blood thinners (anticoagulants) are helpful in reducing the risk of forming abnormal blood clots that can lead to stroke. If you have the irregular heart rhythm of atrial fibrillation, you should be on a blood thinner unless there is a good reason you cannot take them.  Understand all your medicine instructions.  Make sure that other conditions (such as anemia or atherosclerosis) are addressed. SEEK IMMEDIATE MEDICAL CARE IF:   You have sudden weakness or numbness of the face, arm, or leg, especially on one side of the body.  Your face or eyelid droops to one side.  You have sudden confusion.  You have trouble speaking (aphasia) or understanding.  You have sudden trouble seeing in one or both eyes.  You have sudden trouble walking.  You have dizziness.  You have a loss of balance or coordination.  You have a sudden, severe headache with no known cause.  You have new chest pain or an irregular heartbeat. Any of these symptoms may represent a serious problem that is an emergency. Do not wait to see if the symptoms will go away. Get medical help at once. Call your local emergency services (911 in [place] Do not drive yourself to the hospital. Document Released: 04/19/2004 Document Revised: 07/27/2013 Document Reviewed: 09/12/2012 [employer] Patient Information 2015 [employer]. This information is not intended to replace advice given   to you by your health care provider. Make sure you discuss any questions you have with your health care provider.  

## 2014-12-13 NOTE — Addendum Note (Signed)
Addended by: Dorthula Rue L on: 12/13/2014 09:35 AM   Modules accepted: Orders

## 2014-12-16 ENCOUNTER — Encounter (HOSPITAL_COMMUNITY): Payer: Medicare Other

## 2014-12-16 ENCOUNTER — Ambulatory Visit: Payer: Medicare Other | Admitting: Family

## 2015-12-16 ENCOUNTER — Encounter: Payer: Self-pay | Admitting: Family

## 2015-12-22 ENCOUNTER — Ambulatory Visit (HOSPITAL_COMMUNITY)
Admission: RE | Admit: 2015-12-22 | Discharge: 2015-12-22 | Disposition: A | Discharge status: Home / Self Care | Payer: Medicare Other | Source: Ambulatory Visit | Attending: Vascular Surgery | Admitting: Vascular Surgery

## 2015-12-22 ENCOUNTER — Ambulatory Visit (INDEPENDENT_AMBULATORY_CARE_PROVIDER_SITE_OTHER): Payer: Medicare Other | Admitting: Family

## 2015-12-22 ENCOUNTER — Encounter: Payer: Self-pay | Admitting: Family

## 2015-12-22 VITALS — BP 154/64 | HR 80 | Temp 98.3°F | Resp 16 | Ht 62.0 in | Wt 156.0 lb

## 2015-12-22 DIAGNOSIS — I6522 Occlusion and stenosis of left carotid artery: Secondary | ICD-10-CM | POA: Diagnosis not present

## 2015-12-22 DIAGNOSIS — Z9889 Other specified postprocedural states: Secondary | ICD-10-CM

## 2015-12-22 DIAGNOSIS — Z87891 Personal history of nicotine dependence: Secondary | ICD-10-CM

## 2015-12-22 DIAGNOSIS — Z48812 Encounter for surgical aftercare following surgery on the circulatory system: Secondary | ICD-10-CM | POA: Insufficient documentation

## 2015-12-22 DIAGNOSIS — I6521 Occlusion and stenosis of right carotid artery: Secondary | ICD-10-CM | POA: Diagnosis not present

## 2015-12-22 LAB — VAS US CAROTID
RCCADSYS: -86 cm/s
RCCAPSYS: 144 cm/s
RIGHT CCA MID DIAS: -18 cm/s
RIGHT ECA DIAS: -12 cm/s
Right CCA prox dias: 15 cm/s

## 2015-12-22 NOTE — Progress Notes (Signed)
Vitals:   12/22/15 1524 12/22/15 1527  BP: (!) 168/74 (!) 148/73  Pulse: 80 80  Resp: 16   Temp: 98.3 F (36.8 C)   SpO2: 98%   Weight: 156 lb (70.8 kg)   Height: 5\' 2"  (1.575 m)

## 2015-12-22 NOTE — Progress Notes (Signed)
Chief Complaint: Follow up Extracranial Carotid Artery Stenosis   History of Present Illness  Michele Ellis is a 70 y.o. female patient of Dr. Oneida Alar who is s/p right carotid endarterectomy on 02/11/2013 for greater than 90% right internal carotid artery stenosis and calcified plaque, and has a known left internal carotid artery occlusion. She returns today for follow up.  Patient has positive history of stroke about 1990 as manifested by right "drop foot", pt denies any further stroke or TIA activity since then. The patient denies amaurosis fugax or monocular blindness. The patient denies unilateral facial drooping. Pt. denies right arm hemiplegia. The patient denies receptive or expressive aphasia. She still has mild weakness in her right lower leg.  She states her blood pressure at home is usually 120's/70's.    Pt Diabetic: No Pt smoker: former smoker, quit in 1996  Pt meds include: Statin : no, states it was too expensive, she stopped taking about December 2015, states she thinks she informed her PCP. I advised pt to notify her PCP ASAP re this. ASA: Yes Other anticoagulants/antiplatelets: no   Past Medical History:  Diagnosis Date  . Carotid artery occlusion   . Hyperlipidemia   . Hypertension   . Stroke [facility])    1990  below knee on right--balance is off    Social History Social History  Substance Use Topics  . Smoking status: Former Smoker    Packs/day: 1.00    Years: 25.00    Types: Cigarettes    Quit date: 03/26/1994  . Smokeless tobacco: Never Used  . Alcohol use No    Family History Family History  Problem Relation Age of Onset  . Cancer Father     Lung- Black Lung    Surgical History Past Surgical History:  Procedure Laterality Date  . CAROTID ANGIOGRAM N/A 01/02/2013   Procedure: CAROTID ANGIOGRAM;  Surgeon: Elam Dutch, MD;  Location: [facility] CATH LAB;  Service: Cardiovascular;  Laterality: N/A;  . CAROTID ENDARTERECTOMY Right  02-11-13   CE  . CHOLECYSTECTOMY    . ENDARTERECTOMY Right 02/11/2013   Procedure: ENDARTERECTOMY CAROTID;  Surgeon: Elam Dutch, MD;  Location: [place];  Service: Vascular;  Laterality: Right;  . FRACTURE SURGERY     RT arm  . PATCH ANGIOPLASTY Right 02/11/2013   Procedure: PATCH ANGIOPLASTY OF RIGHT CAROTID ARTERY  USING HEMASHIELD PLATINUM FINESSE PATCH;  Surgeon: Elam Dutch, MD;  Location: [place];  Service: Vascular;  Laterality: Right;  . rectal abscess     perirectal  . RIGHT ARM     SURGERY, ALSO HAS METAL PLATE    Allergies  Allergen Reactions  . Doxycycline Rash    Rash on hands and feet.    Current Outpatient Prescriptions  Medication Sig Dispense Refill  . amLODipine (NORVASC) 10 MG tablet Take 10 mg by mouth daily.    Marland Kitchen aspirin 325 MG tablet Take 325 mg by mouth daily.    Marland Kitchen lisinopril-hydrochlorothiazide (PRINZIDE,ZESTORETIC) 20-25 MG per tablet Take 1 tablet by mouth daily.    Vladimir Faster Glycol-Propyl Glycol (SYSTANE) 0.4-0.3 % SOLN Apply 1-2 drops to eye daily as needed (for dry eyes).    . mupirocin ointment (BACTROBAN) 2 % 1 Application, nasally, 2 times daily for 5 days prior to surgery (Patient not taking: Reported on 12/22/2015) 22 g 0  . oxyCODONE-acetaminophen (PERCOCET/ROXICET) 5-325 MG per tablet Take 1 tablet by mouth every 6 (six) hours as needed for moderate pain. (Patient not taking: Reported on  12/22/2015) 30 tablet 0  . rosuvastatin (CRESTOR) 5 MG tablet Take 5 mg by mouth daily.     No current facility-administered medications for this visit.     Review of Systems : See HPI for pertinent positives and negatives.  Physical Examination  Vitals:   12/22/15 1524 12/22/15 1527 12/22/15 1529  BP: (!) 168/74 (!) 148/73 (!) 154/64  Pulse: 80 80 80  Resp: 16    Temp: 98.3 F (36.8 C)    SpO2: 98%    Weight: 156 lb (70.8 kg)    Height: 5\' 2"  (1.575 m)     Body mass index is 28.53 kg/m.  General: WDWN overweight female in NAD GAIT:  normal Eyes: PERRLA Pulmonary: Respirations are non-labored, CTAB, no rales, rhonchi, or wheezing.  Cardiac: regular rhythm and rate, no detected murmur.  VASCULAR EXAM Carotid Bruits Right Left   Positive Positive   Aorta is not palpable. Radial pulses are 2+ palpable and equal.  Pedal pulses are palpable.   Gastrointestinal: soft, nontender, BS WNL, no r/g, no palpable masses.  Musculoskeletal: No muscle atrophy/wasting. M/S 5/5 throughout, Extremities without ischemic changes, no peripheral edema  Neurologic: A&O X 3; Appropriate Affect,  Speech is normal CN 2-12 intact, Pain and light touch intact in extremities, Motor exam as listed above.   Assessment: Michele Ellis is a 70 y.o. female who is s/p right carotid endarterectomy on 02/11/2013 for greater than 90% right internal carotid artery stenosis and calcified plaque, and has a known left internal carotid artery occlusion. She has had no stroke or TIA activity since 1990, has mild residual right leg weakness. She denies steal symptoms in either upper extremity. Fortunately she does not have DM and quit smoking in 1996. She takes a daily ASA but is not taking prescribed statin.   DATA Today's carotid duplex suggests no significant stenosis of the bilateral ECA or CCA.  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 Known history of left ICA occlusion. Right CEA site with no restenosis.  No significant change in the right carotid artery system compared to the previous exam of 12/10/14.    Plan: Follow-up in 1 year with Carotid Duplex scan.   I discussed in depth with the patient the nature of atherosclerosis, and emphasized the importance of maximal medical management including strict control of blood pressure, blood glucose, and lipid levels, obtaining regular exercise, and continued  cessation of smoking.  The patient is aware that without maximal medical management the underlying atherosclerotic disease process will progress, limiting the benefit of any interventions. The patient was given information about stroke prevention and what symptoms should prompt the patient to seek immediate medical care. Thank you for allowing [place] to participate in this patient's care.  Clemon Chambers, RN, MSN, FNP-C [facility] [place] Office: [phone]  Clinic Physician: Bridgett Larsson  12/22/15 3:49 PM

## 2015-12-22 NOTE — Patient Instructions (Signed)
Stroke Prevention Some medical conditions and behaviors are associated with an increased chance of having a stroke. You may prevent a stroke by making healthy choices and managing medical conditions. HOW CAN I REDUCE MY RISK OF HAVING A STROKE?   Stay physically active. Get at least 30 minutes of activity on most or all days.  Do not smoke. It may also be helpful to avoid exposure to secondhand smoke.  Limit alcohol use. Moderate alcohol use is considered to be:  No more than 2 drinks per day for men.  No more than 1 drink per day for nonpregnant women.  Eat healthy foods. This involves:  Eating 5 or more servings of fruits and vegetables a day.  Making dietary changes that address high blood pressure (hypertension), high cholesterol, diabetes, or obesity.  Manage your cholesterol levels.  Making food choices that are high in fiber and low in saturated fat, trans fat, and cholesterol may control cholesterol levels.  Take any prescribed medicines to control cholesterol as directed by your health care provider.  Manage your diabetes.  Controlling your carbohydrate and sugar intake is recommended to manage diabetes.  Take any prescribed medicines to control diabetes as directed by your health care provider.  Control your hypertension.  Making food choices that are low in salt (sodium), saturated fat, trans fat, and cholesterol is recommended to manage hypertension.  Ask your health care provider if you need treatment to lower your blood pressure. Take any prescribed medicines to control hypertension as directed by your health care provider.  If you are 18-39 years of age, have your blood pressure checked every 3-5 years. If you are 40 years of age or older, have your blood pressure checked every year.  Maintain a healthy weight.  Reducing calorie intake and making food choices that are low in sodium, saturated fat, trans fat, and cholesterol are recommended to manage  weight.  Stop drug abuse.  Avoid taking birth control pills.  Talk to your health care provider about the risks of taking birth control pills if you are over 35 years old, smoke, get migraines, or have ever had a blood clot.  Get evaluated for sleep disorders (sleep apnea).  Talk to your health care provider about getting a sleep evaluation if you snore a lot or have excessive sleepiness.  Take medicines only as directed by your health care provider.  For some people, aspirin or blood thinners (anticoagulants) are helpful in reducing the risk of forming abnormal blood clots that can lead to stroke. If you have the irregular heart rhythm of atrial fibrillation, you should be on a blood thinner unless there is a good reason you cannot take them.  Understand all your medicine instructions.  Make sure that other conditions (such as anemia or atherosclerosis) are addressed. SEEK IMMEDIATE MEDICAL CARE IF:   You have sudden weakness or numbness of the face, arm, or leg, especially on one side of the body.  Your face or eyelid droops to one side.  You have sudden confusion.  You have trouble speaking (aphasia) or understanding.  You have sudden trouble seeing in one or both eyes.  You have sudden trouble walking.  You have dizziness.  You have a loss of balance or coordination.  You have a sudden, severe headache with no known cause.  You have new chest pain or an irregular heartbeat. Any of these symptoms may represent a serious problem that is an emergency. Do not wait to see if the symptoms will   go away. Get medical help at once. Call your local emergency services (911 in [place] Do not drive yourself to the hospital.   This information is not intended to replace advice given to you by your health care provider. Make sure you discuss any questions you have with your health care provider.   Document Released: 04/19/2004 Document Revised: 04/02/2014 Document Reviewed:  09/12/2012 Elsevier Interactive Patient Education 2016 Elsevier Inc.  

## 2016-02-21 NOTE — Addendum Note (Signed)
Addended by: Lianne Cure A on: 02/21/2016 02:39 PM   Modules accepted: Orders

## 2016-12-27 ENCOUNTER — Encounter: Payer: Self-pay | Admitting: Family

## 2016-12-27 ENCOUNTER — Ambulatory Visit (HOSPITAL_COMMUNITY)
Admission: RE | Admit: 2016-12-27 | Discharge: 2016-12-27 | Disposition: A | Discharge status: Home / Self Care | Payer: Medicare Other | Source: Ambulatory Visit | Attending: Vascular Surgery | Admitting: Vascular Surgery

## 2016-12-27 ENCOUNTER — Ambulatory Visit (INDEPENDENT_AMBULATORY_CARE_PROVIDER_SITE_OTHER): Payer: Medicare Other | Admitting: Family

## 2016-12-27 VITALS — BP 142/72 | HR 76 | Resp 16 | Ht 62.0 in | Wt 167.8 lb

## 2016-12-27 DIAGNOSIS — Z9889 Other specified postprocedural states: Secondary | ICD-10-CM

## 2016-12-27 DIAGNOSIS — I6521 Occlusion and stenosis of right carotid artery: Secondary | ICD-10-CM | POA: Diagnosis not present

## 2016-12-27 DIAGNOSIS — Z87891 Personal history of nicotine dependence: Secondary | ICD-10-CM | POA: Insufficient documentation

## 2016-12-27 DIAGNOSIS — I6522 Occlusion and stenosis of left carotid artery: Secondary | ICD-10-CM

## 2016-12-27 DIAGNOSIS — I6523 Occlusion and stenosis of bilateral carotid arteries: Secondary | ICD-10-CM | POA: Insufficient documentation

## 2016-12-27 LAB — VAS US CAROTID
LCCAPSYS: 111 cm/s
LEFT ECA DIAS: -21 cm/s
LEFT VERTEBRAL DIAS: -18 cm/s
Left CCA dist dias: -10 cm/s
Left CCA dist sys: -128 cm/s
Left CCA prox dias: 16 cm/s
Left ICA prox dias: -19 cm/s
Left ICA prox sys: -64 cm/s
RCCAPDIAS: 23 cm/s
RIGHT CCA MID DIAS: 21 cm/s
RIGHT ECA DIAS: -11 cm/s
RIGHT VERTEBRAL DIAS: -24 cm/s
Right CCA prox sys: 174 cm/s
Right cca dist sys: -121 cm/s

## 2016-12-27 NOTE — Progress Notes (Signed)
Chief Complaint: Follow up Extracranial Carotid Artery Stenosis   History of Present Illness  Michele Ellis is a 71 y.o. female patient of Dr. Oneida Alar who is s/p right carotid endarterectomy on 02/11/2013 for greater than 90% right internal carotid artery stenosis and calcified plaque, and has a known left internal carotid artery occlusion. She returns today for follow up.  Patient has positive history of stroke about 1990 as manifested by right "drop foot", pt denies any further stroke or TIA activity since then. The patient denies amaurosis fugax or monocular blindness. The patient denies unilateral facial drooping. Pt. denies right arm hemiplegia. The patient denies receptive or expressive aphasia. She still has mild weakness in her right lower leg.  She denies claudication sx's with walking.   She states her blood pressure at home is usually 120's/70's.    Pt Diabetic: No Pt smoker: former smoker, quit in 1996  Pt meds include: Statin : no, states it was too expensive, she stopped taking about December 2015, states she informed her PCP. ASA: Yes Other anticoagulants/antiplatelets: no   Past Medical History:  Diagnosis Date  . Carotid artery occlusion   . Hyperlipidemia   . Hypertension   . Stroke [facility])    1990  below knee on right--balance is off    Social History Social History  Substance Use Topics  . Smoking status: Former Smoker    Packs/day: 1.00    Years: 25.00    Types: Cigarettes    Quit date: 03/26/1994  . Smokeless tobacco: Never Used  . Alcohol use No    Family History Family History  Problem Relation Age of Onset  . Cancer Father        Lung- Black Lung    Surgical History Past Surgical History:  Procedure Laterality Date  . CAROTID ANGIOGRAM N/A 01/02/2013   Procedure: CAROTID ANGIOGRAM;  Surgeon: Elam Dutch, MD;  Location: [facility] CATH LAB;  Service: Cardiovascular;  Laterality: N/A;  . CAROTID ENDARTERECTOMY Right 02-11-13   CE  . CHOLECYSTECTOMY    . ENDARTERECTOMY Right 02/11/2013   Procedure: ENDARTERECTOMY CAROTID;  Surgeon: Elam Dutch, MD;  Location: [place];  Service: Vascular;  Laterality: Right;  . FRACTURE SURGERY     RT arm  . PATCH ANGIOPLASTY Right 02/11/2013   Procedure: PATCH ANGIOPLASTY OF RIGHT CAROTID ARTERY  USING HEMASHIELD PLATINUM FINESSE PATCH;  Surgeon: Elam Dutch, MD;  Location: [place];  Service: Vascular;  Laterality: Right;  . rectal abscess     perirectal  . RIGHT ARM     SURGERY, ALSO HAS METAL PLATE    Allergies  Allergen Reactions  . Doxycycline Rash    Rash on hands and feet.    Current Outpatient Prescriptions  Medication Sig Dispense Refill  . amLODipine (NORVASC) 10 MG tablet Take 10 mg by mouth daily.    Marland Kitchen aspirin 325 MG tablet Take 325 mg by mouth daily.    Marland Kitchen lisinopril-hydrochlorothiazide (PRINZIDE,ZESTORETIC) 20-25 MG per tablet Take 1 tablet by mouth daily.    Vladimir Faster Glycol-Propyl Glycol (SYSTANE) 0.4-0.3 % SOLN Apply 1-2 drops to eye daily as needed (for dry eyes).     No current facility-administered medications for this visit.     Review of Systems : See HPI for pertinent positives and negatives.  Physical Examination  Vitals:   12/27/16 1435 12/27/16 1437  BP: (!) 141/62 (!) 142/72  Pulse: 76   Resp: 16   SpO2: 98%   Weight: 167 lb  12.8 oz (76.1 kg)   Height: 5\' 2"  (1.575 m)    Body mass index is 30.69 kg/m.  General: WDWN overweight obese female in NAD GAIT:normal Eyes: PERRLA Pulmonary: Respirations are non-labored, CTAB, no rales, rhonchi, or wheezing.  Cardiac: regular rhythm and rate, no detected murmur.  VASCULAR EXAM Carotid Bruits Right Left   Positive Positive   Aorta is not palpable. Radial pulses are 2+ palpable and equal.  Pedal pulses are palpable.   Gastrointestinal: soft, nontender, BS  WNL, no r/g, no palpable masses.  Musculoskeletal: No muscle atrophy/wasting. M/S 5/5 throughout, Extremities without ischemic changes, no peripheral edema  Neurologic: A&O X 3; Appropriate Affect,  Speech is normal CN 2-12 intact, Pain and light touch intact in extremities, Motor exam as listed above.     Assessment: Michele Ellis is a 70 y.o. female who is s/p right carotid endarterectomy on 02/11/2013 for greater than 90% right internal carotid artery stenosis and calcified plaque, and has a known left internal carotid artery occlusion. She has had no stroke or TIA activity since 1990, has mild residual right leg weakness. She denies steal symptoms in either upper extremity. Fortunately she does not have DM and quit smoking in 1996. She takes a daily ASA but is not taking prescribed statin.   DATA Carotid Duplex (12/27/16); Biphasic subclavian arteries with right subclavian 247 cm/s (plaque visualized) and the left subclavian at 152 cm/s, no plaque visualized. Narrowing/ plaque visualized in the proximal right ICA. Right ICA: CEA site with no restenosis. Left KDX:IPJAS occluded. Bilateral vertebral artery flow is antegrade. No significant change compared to exam on 12-22-15.     Plan: Follow-up in 1 year with Carotid Duplex scan.     I discussed in depth with the patient the nature of atherosclerosis, and emphasized the importance of maximal medical management including strict control of blood pressure, blood glucose, and lipid levels, obtaining regular exercise, and continued cessation of smoking.  The patient is aware that without maximal medical management the underlying atherosclerotic disease process will progress, limiting the benefit of any interventions. The patient was given information about stroke prevention and what symptoms should prompt the patient to seek immediate medical care. Thank you for allowing [place] to participate in this patient's care.  Clemon Chambers, RN, MSN, FNP-C [facility] [place] Office: [phone]  Clinic Physician: Oneida Alar  12/27/16 2:43 PM

## 2016-12-27 NOTE — Patient Instructions (Signed)
Stroke Prevention Some medical conditions and behaviors are associated with an increased chance of having a stroke. You may prevent a stroke by making healthy choices and managing medical conditions. How can I reduce my risk of having a stroke?  Stay physically active. Get at least 30 minutes of activity on most or all days.  Do not smoke. It may also be helpful to avoid exposure to secondhand smoke.  Limit alcohol use. Moderate alcohol use is considered to be:  No more than 2 drinks per day for men.  No more than 1 drink per day for nonpregnant women.  Eat healthy foods. This involves:  Eating 5 or more servings of fruits and vegetables a day.  Making dietary changes that address high blood pressure (hypertension), high cholesterol, diabetes, or obesity.  Manage your cholesterol levels.  Making food choices that are high in fiber and low in saturated fat, trans fat, and cholesterol may control cholesterol levels.  Take any prescribed medicines to control cholesterol as directed by your health care provider.  Manage your diabetes.  Controlling your carbohydrate and sugar intake is recommended to manage diabetes.  Take any prescribed medicines to control diabetes as directed by your health care provider.  Control your hypertension.  Making food choices that are low in salt (sodium), saturated fat, trans fat, and cholesterol is recommended to manage hypertension.  Ask your health care provider if you need treatment to lower your blood pressure. Take any prescribed medicines to control hypertension as directed by your health care provider.  If you are 18-39 years of age, have your blood pressure checked every 3-5 years. If you are 40 years of age or older, have your blood pressure checked every year.  Maintain a healthy weight.  Reducing calorie intake and making food choices that are low in sodium, saturated fat, trans fat, and cholesterol are recommended to manage  weight.  Stop drug abuse.  Avoid taking birth control pills.  Talk to your health care provider about the risks of taking birth control pills if you are over 35 years old, smoke, get migraines, or have ever had a blood clot.  Get evaluated for sleep disorders (sleep apnea).  Talk to your health care provider about getting a sleep evaluation if you snore a lot or have excessive sleepiness.  Take medicines only as directed by your health care provider.  For some people, aspirin or blood thinners (anticoagulants) are helpful in reducing the risk of forming abnormal blood clots that can lead to stroke. If you have the irregular heart rhythm of atrial fibrillation, you should be on a blood thinner unless there is a good reason you cannot take them.  Understand all your medicine instructions.  Make sure that other conditions (such as anemia or atherosclerosis) are addressed. Get help right away if:  You have sudden weakness or numbness of the face, arm, or leg, especially on one side of the body.  Your face or eyelid droops to one side.  You have sudden confusion.  You have trouble speaking (aphasia) or understanding.  You have sudden trouble seeing in one or both eyes.  You have sudden trouble walking.  You have dizziness.  You have a loss of balance or coordination.  You have a sudden, severe headache with no known cause.  You have new chest pain or an irregular heartbeat. Any of these symptoms may represent a serious problem that is an emergency. Do not wait to see if the symptoms will go away.   Get medical help at once. Call your local emergency services (911 in [place] Do not drive yourself to the hospital. This information is not intended to replace advice given to you by your health care provider. Make sure you discuss any questions you have with your health care provider. Document Released: 04/19/2004 Document Revised: 08/18/2015 Document Reviewed: 09/12/2012 Elsevier  Interactive Patient Education  2017 Elsevier Inc.  

## 2017-02-20 NOTE — Addendum Note (Signed)
Addended by: Lianne Cure A on: 02/20/2017 01:06 PM   Modules accepted: Orders

## 2017-12-26 ENCOUNTER — Ambulatory Visit (HOSPITAL_COMMUNITY)
Admission: RE | Admit: 2017-12-26 | Discharge: 2017-12-26 | Disposition: A | Discharge status: Home / Self Care | Payer: Medicare Other | Source: Ambulatory Visit | Attending: Vascular Surgery | Admitting: Vascular Surgery

## 2017-12-26 ENCOUNTER — Other Ambulatory Visit: Payer: Self-pay

## 2017-12-26 ENCOUNTER — Encounter: Payer: Self-pay | Admitting: Family

## 2017-12-26 ENCOUNTER — Ambulatory Visit (INDEPENDENT_AMBULATORY_CARE_PROVIDER_SITE_OTHER): Payer: Medicare Other | Admitting: Family

## 2017-12-26 VITALS — BP 161/77 | HR 67 | Temp 97.6°F | Resp 16 | Ht 62.0 in | Wt 162.0 lb

## 2017-12-26 DIAGNOSIS — Z9889 Other specified postprocedural states: Secondary | ICD-10-CM

## 2017-12-26 DIAGNOSIS — I6522 Occlusion and stenosis of left carotid artery: Secondary | ICD-10-CM

## 2017-12-26 DIAGNOSIS — I6521 Occlusion and stenosis of right carotid artery: Secondary | ICD-10-CM

## 2017-12-26 DIAGNOSIS — Z87891 Personal history of nicotine dependence: Secondary | ICD-10-CM | POA: Insufficient documentation

## 2017-12-26 DIAGNOSIS — I6523 Occlusion and stenosis of bilateral carotid arteries: Secondary | ICD-10-CM | POA: Insufficient documentation

## 2017-12-26 NOTE — Progress Notes (Signed)
Chief Complaint: Follow up Extracranial Carotid Artery Stenosis   History of Present Illness  Michele Ellis is a 72 y.o. female who is s/p right carotid endarterectomy on 02/11/2013 by Dr. Oneida Alar for greater than 90% right internal carotid artery stenosis and calcified plaque, and has a known left internal carotid artery occlusion. She returns today for follow up.  Patient has history of strokeabout 1990 as manifested by right "drop foot", pt denies any further stroke or TIA activity since then. The patient denies amaurosis fugax or monocular blindness. The patient denies unilateral facial drooping. Pt. denies right arm hemiplegia. The patient denies receptive or expressive aphasia. She still has mild weakness in her right lower leg.  She denies claudication sx's with walking.   She states her blood pressure at home is usually 130's/70's.   Diabetic: No Tobacco use: former smoker, quit in 1996, started in her teens  Pt meds include: Statin : no, states it was too expensive, she stopped taking about December 2015, states sheinformed her PCP. ASA: Yes Other anticoagulants/antiplatelets: no   Past Medical History:  Diagnosis Date  . Carotid artery occlusion   . Hyperlipidemia   . Hypertension   . Stroke [facility])    1990  below knee on right--balance is off    Social History Social History   Tobacco Use  . Smoking status: Former Smoker    Packs/day: 1.00    Years: 25.00    Pack years: 25.00    Types: Cigarettes    Last attempt to quit: 03/26/1994    Years since quitting: 23.7  . Smokeless tobacco: Never Used  Substance Use Topics  . Alcohol use: No    Alcohol/week: 0.0 standard drinks  . Drug use: No    Family History Family History  Problem Relation Age of Onset  . Cancer Father        Lung- Black Lung    Surgical History Past Surgical History:  Procedure Laterality Date  . CAROTID ANGIOGRAM N/A 01/02/2013   Procedure: CAROTID ANGIOGRAM;   Surgeon: Elam Dutch, MD;  Location: [facility] CATH LAB;  Service: Cardiovascular;  Laterality: N/A;  . CAROTID ENDARTERECTOMY Right 02-11-13   CE  . CHOLECYSTECTOMY    . ENDARTERECTOMY Right 02/11/2013   Procedure: ENDARTERECTOMY CAROTID;  Surgeon: Elam Dutch, MD;  Location: [place];  Service: Vascular;  Laterality: Right;  . FRACTURE SURGERY     RT arm  . PATCH ANGIOPLASTY Right 02/11/2013   Procedure: PATCH ANGIOPLASTY OF RIGHT CAROTID ARTERY  USING HEMASHIELD PLATINUM FINESSE PATCH;  Surgeon: Elam Dutch, MD;  Location: [place];  Service: Vascular;  Laterality: Right;  . rectal abscess     perirectal  . RIGHT ARM     SURGERY, ALSO HAS METAL PLATE    Allergies  Allergen Reactions  . Doxycycline Rash    Rash on hands and feet.    Current Outpatient Medications  Medication Sig Dispense Refill  . amLODipine (NORVASC) 10 MG tablet Take 10 mg by mouth daily.    Marland Kitchen aspirin 325 MG tablet Take 325 mg by mouth daily.    Marland Kitchen lisinopril-hydrochlorothiazide (PRINZIDE,ZESTORETIC) 20-25 MG per tablet Take 1 tablet by mouth daily.    Vladimir Faster Glycol-Propyl Glycol (SYSTANE) 0.4-0.3 % SOLN Apply 1-2 drops to eye daily as needed (for dry eyes).     No current facility-administered medications for this visit.     Review of Systems : See HPI for pertinent positives and negatives.  Physical Examination  Vitals:   12/26/17 1601 12/26/17 1603  BP: (!) 147/72 (!) 161/77  Pulse: 67   Resp: 16   Temp: 97.6 F (36.4 C)   TempSrc: Oral   SpO2: 99%   Weight: 162 lb (73.5 kg)   Height: 5\' 2"  (1.575 m)    Body mass index is 29.63 kg/m.  General: WDWN female in NAD GAIT: normal Eyes: PERRLA HENT: No gross abnormalities.  Pulmonary:  Respirations are non-labored, good air movement in all fields, CTAB, rales, rhonchi, or wheezes. Cardiac: regular rhythm, no detected murmur.  VASCULAR EXAM Carotid Bruits Right Left   Positive Negative     Abdominal aortic pulse is not  palpable. Radial pulses are 2+ palpable and equal.                                                                                                                            LE Pulses Right Left       POPLITEAL  not palpable   not palpable       POSTERIOR TIBIAL  not palpable   not palpable        DORSALIS PEDIS      ANTERIOR TIBIAL faintly palpable  faintly palpable     Gastrointestinal: soft, nontender, BS WNL, no r/g,no palpable masses. Musculoskeletal: no muscle atrophy/wasting. M/S 5/5 throughout except 4/5 right LE, extremities without ischemic changes. Skin: No rashes, no ulcers, no cellulitis.   Neurologic:  A&O X 3; appropriate affect, sensation is normal; speech is normal, CN 2-12 intact, pain and light touch intact in extremities, motor exam as listed above. Psychiatric: Normal thought content, mood appropriate to clinical situation.    Assessment: Michele Ellis is a 72 y.o. female who is s/p right carotid endarterectomy on 02/11/2013 for greater than 90% right internal carotid artery stenosis and calcified plaque, and has a known left internal carotid artery occlusion. She has had no stroke or TIA activity since 1990, has mild residual right leg weakness. She denies steal symptoms in either upper extremity. Fortunately she does not have DM and quit smoking in 1996. She takes a daily ASA but is not taking prescribed statin.   DATA Carotid Duplex (12-26-17): Right ICA: CEA site with no restenosis Left ICA: confimred occlusion. Left vertebral artery flow is antegrade, right is antegrade/atypical.  Left subclavian artery waveforms are normal, right are stenotic. No significant change compared to the exams on 12-20-16 and 12-27-16.    Plan:  Follow-up in 18 monthswith Carotid Duplex scan.  I discussed in depth with the patient the nature of atherosclerosis, and emphasized the importance of maximal medical management including strict control of blood pressure,  blood glucose, and lipid levels, obtaining regular exercise, and continued cessation of smoking.  The patient is aware that without maximal medical management the underlying atherosclerotic disease process will progress, limiting the benefit of any interventions. The patient was given information about stroke prevention and what symptoms should prompt the patient to  seek immediate medical care. Thank you for allowing [place] to participate in this patient's care.  Clemon Chambers, RN, MSN, FNP-C [facility] Office: [phone]  Clinic Physician: Oneida Alar  12/26/17 4:35 PM

## 2017-12-26 NOTE — Patient Instructions (Signed)

## 2020-06-23 ENCOUNTER — Other Ambulatory Visit (INDEPENDENT_AMBULATORY_CARE_PROVIDER_SITE_OTHER): Payer: Medicare Other

## 2020-06-23 ENCOUNTER — Ambulatory Visit (INDEPENDENT_AMBULATORY_CARE_PROVIDER_SITE_OTHER): Payer: Medicare Other | Admitting: Nurse Practitioner

## 2020-06-23 ENCOUNTER — Telehealth: Payer: Self-pay | Admitting: *Deleted

## 2020-06-23 ENCOUNTER — Other Ambulatory Visit: Payer: Medicare Other

## 2020-06-23 ENCOUNTER — Encounter: Payer: Self-pay | Admitting: Nurse Practitioner

## 2020-06-23 VITALS — BP 160/86 | HR 91 | Ht 62.0 in | Wt 118.0 lb

## 2020-06-23 DIAGNOSIS — R6881 Early satiety: Secondary | ICD-10-CM

## 2020-06-23 DIAGNOSIS — R1012 Left upper quadrant pain: Secondary | ICD-10-CM

## 2020-06-23 DIAGNOSIS — R634 Abnormal weight loss: Secondary | ICD-10-CM | POA: Diagnosis not present

## 2020-06-23 LAB — COMPREHENSIVE METABOLIC PANEL
ALT: 10 U/L (ref 0–35)
AST: 13 U/L (ref 0–37)
Albumin: 3.9 g/dL (ref 3.5–5.2)
Alkaline Phosphatase: 46 U/L (ref 39–117)
BUN: 21 mg/dL (ref 6–23)
CO2: 32 mEq/L (ref 19–32)
Calcium: 9.5 mg/dL (ref 8.4–10.5)
Chloride: 100 mEq/L (ref 96–112)
Creatinine, Ser: 0.97 mg/dL (ref 0.40–1.20)
GFR: 57.23 mL/min — ABNORMAL LOW (ref >60.00)
Glucose, Bld: 97 mg/dL (ref 70–99)
Potassium: 3.1 mEq/L — ABNORMAL LOW (ref 3.5–5.1)
Sodium: 140 mEq/L (ref 135–145)
Total Bilirubin: 0.4 mg/dL (ref 0.2–1.2)
Total Protein: 7 g/dL (ref 6.0–8.3)

## 2020-06-23 LAB — CBC
HCT: 31.5 % — ABNORMAL LOW (ref 36.0–46.0)
Hemoglobin: 10.1 g/dL — ABNORMAL LOW (ref 12.0–15.0)
MCHC: 32 g/dL (ref 30.0–36.0)
MCV: 76.5 fl — ABNORMAL LOW (ref 78.0–100.0)
Platelets: 365 10*3/uL (ref 150.0–400.0)
RBC: 4.11 Mil/uL (ref 3.87–5.11)
RDW: 19.9 % — ABNORMAL HIGH (ref 11.5–15.5)
WBC: 7.3 10*3/uL (ref 4.0–10.5)

## 2020-06-23 LAB — TSH: TSH: 2.42 u[IU]/mL (ref 0.35–4.50)

## 2020-06-23 MED ORDER — PLENVU 140 G PO SOLR: ORAL | 0 refills | Status: DC

## 2020-06-23 NOTE — Telephone Encounter (Signed)
Letter faxed to [place]    Dr Josephine Igo  Phone number [phone]

## 2020-06-23 NOTE — Telephone Encounter (Signed)
  06/23/2020   RE: Michele Ellis DOB: Jan 28, 1946 MRN: [id number]   Dear Dr Josephine Igo  We have scheduled the above patient for an endoscopic procedure. Our records show that she is on anticoagulation therapy.   Please advise as to how long the patient may come off her therapy of Plavix prior to the procedure, which is scheduled for 09/07/2020.  Please fax back/ or route the completed form to [place] or Jan at [phone].   Sincerely,    Tonita Phoenix AAMA

## 2020-06-23 NOTE — Progress Notes (Signed)
Agree with assessment and plan as outlined.  

## 2020-06-23 NOTE — Patient Instructions (Signed)
You have been scheduled for an endoscopy and colonoscopy. Please follow the written instructions given to you at your visit today. Please pick up your prep supplies at the pharmacy within the next 1-3 days. If you use inhalers (even only as needed), please bring them with you on the day of your procedure.   You will be contacted by our office prior to your procedure for directions on holding your Plavix.  If you do not hear from our office 1 week prior to your scheduled procedure, please call [phone] to discuss.  Your provider has requested that you go to the basement level for lab work before leaving today. Press "B" on the elevator. The lab is located at the first door on the left as you exit the elevator.  Due to recent changes in healthcare laws, you may see the results of your imaging and laboratory studies on MyChart before your provider has had a chance to review them.  We understand that in some cases there may be results that are confusing or concerning to you. Not all laboratory results come back in the same time frame and the provider may be waiting for multiple results in order to interpret others.  Please give [place] 48 hours in order for your provider to thoroughly review all the results before contacting the office for clarification of your results.   I appreciate the  opportunity to care for you  Thank You   [place]

## 2020-06-23 NOTE — Progress Notes (Signed)
ASSESSMENT AND PLAN    # 75 yo female with early satiety, unexplained 40+ weight loss over the last year. She has chronic intermittent LUQ pain which may be unrelated.  --Patient will be scheduled for an EGD. The risks and benefits of EGD were discussed and the patient agrees to proceed.  upper endoscopy  --Given weight loss and no prior colon cancer screening it is reasonable to proceed with a screening colonoscopy to be done at time of EGD.  The risks and benefits of colonoscopy with possible polypectomy / biopsies were discussed and the patient agrees to proceed.  --Obtain liver chemistries, CBC, lipase, TSH.   --If all above is negative then may need CTscan to exclude malignancy.  # History of TIA, on Plavix and daily ASA.  --Hold Plavix for 5 days before procedure - will instruct when and how to resume after procedure. Patient understands that there is a low but real risk of cardiovascular event such as heart attack, stroke, or embolism /  thrombosis, or ischemia while off Plavix. The patient consents to proceed. Will communicate by phone or EMR with patient's prescribing provider to confirm that holding Plavix is reasonable in this case.     HISTORY OF PRESENT ILLNESS     Chief Complaint : left upper abdominal abdominal pain, weight loss and gets full easily.   Michele Ellis is a 75 y.o. female with a past medical history significant for disease, carotid artery disease status post remote right carotid endarterectomy, CVA , hyperlipidemia, hypertension, cholecystectomy  Patient referred by PCP for abdominal pain ( no records available).  She describes LUQ pain as an intermittent, achy discomfort . The pain does not radiate anywhere.  She has no idea what triggers the pain as it is not related to eating, bending / twisting. Sometimes just the weight of her shirt is bothersome. The pain first started several months ago. Episdoes last less than 5 minutes and occur several times a  month. The pain isn't relieved with defecation. She hasn't tried any heat or OTC pain relievers.  Her bowel movments are generally normal. Occasional takes Miralax. No blood in blood. No history of a colonoscopy. No [place] of colon cancer.    Patient has unintentionally lost at least 40 pounds over the last year due to early satiety. No nausea or vomiting. No dysphagia. She does occasionally get heartburn relieved with Tums.  She cannot correlate weight loss with any medication or dietary changes. No night sweats.    Data Reviewed: March 2021  hemoglobin 12.4  PREVIOUS EVALUATIONS:    Past Medical History:  Diagnosis Date  . Carotid artery occlusion   . Hyperlipidemia   . Hypertension   . Stroke ([place])    1990  below knee on right--balance is off     Past Surgical History:  Procedure Laterality Date  . CAROTID ANGIOGRAM N/A 01/02/2013   Procedure: CAROTID ANGIOGRAM;  Surgeon: Elam Dutch, MD;  Location: [facility] CATH LAB;  Service: Cardiovascular;  Laterality: N/A;  . CAROTID ENDARTERECTOMY Right 02-11-13   CE  . CHOLECYSTECTOMY    . ENDARTERECTOMY Right 02/11/2013   Procedure: ENDARTERECTOMY CAROTID;  Surgeon: Elam Dutch, MD;  Location: [place];  Service: Vascular;  Laterality: Right;  . FRACTURE SURGERY     RT arm  . PATCH ANGIOPLASTY Right 02/11/2013   Procedure: PATCH ANGIOPLASTY OF RIGHT CAROTID ARTERY  USING HEMASHIELD PLATINUM FINESSE PATCH;  Surgeon: Elam Dutch, MD;  Location: [place];  Service: Vascular;  Laterality: Right;  . rectal abscess     perirectal  . RIGHT ARM     SURGERY, ALSO HAS METAL PLATE   Family History  Problem Relation Age of Onset  . Cancer Father        Lung- Black Lung   Social History   Tobacco Use  . Smoking status: Former Smoker    Packs/day: 1.00    Years: 25.00    Pack years: 25.00    Types: Cigarettes    Quit date: 03/26/1994    Years since quitting: 26.2  . Smokeless tobacco: Never Used  Vaping Use  . Vaping Use: Never  used  Substance Use Topics  . Alcohol use: No    Alcohol/week: 0.0 standard drinks  . Drug use: No   Current Outpatient Medications  Medication Sig Dispense Refill  . amLODipine (NORVASC) 10 MG tablet Take 10 mg by mouth daily.    Marland Kitchen aspirin 325 MG tablet Take 325 mg by mouth daily.    Marland Kitchen lisinopril-hydrochlorothiazide (PRINZIDE,ZESTORETIC) 20-25 MG per tablet Take 1 tablet by mouth daily.    Vladimir Faster Glycol-Propyl Glycol (SYSTANE) 0.4-0.3 % SOLN Apply 1-2 drops to eye daily as needed (for dry eyes).     No current facility-administered medications for this visit.   Allergies  Allergen Reactions  . Doxycycline Rash    Rash on hands and feet.     Review of Systems:  All systems reviewed and negative except where noted in HPI.   PHYSICAL EXAM :    Wt Readings from Last 3 Encounters:  06/23/20 118 lb (53.5 kg)  12/26/17 162 lb (73.5 kg)  12/27/16 167 lb 12.8 oz (76.1 kg)    Ht 5\' 2"  (1.575 m)   Wt 118 lb (53.5 kg)   BMI 21.58 kg/m  Constitutional:  Pleasant female in no acute distress. Psychiatric: Normal mood and affect. Behavior is normal. EENT: Pupils normal.  Conjunctivae are normal. No scleral icterus. Neck supple.  Cardiovascular: Normal rate, regular rhythm. No edema Pulmonary/chest: Effort normal and breath sounds normal. No wheezing, rales or rhonchi. Abdominal: Soft, nondistended, nontender. Bowel sounds active throughout. There are no masses palpable. No hepatomegaly. Midline bruit present  Neurological: Alert and oriented to person place and time. Skin: Skin is warm and dry. No rashes noted.  Tye Savoy, NP  06/23/2020, 10:38 AM  Cc:  Referring Provider Josephine Igo, MD

## 2020-06-24 ENCOUNTER — Other Ambulatory Visit: Payer: Self-pay

## 2020-06-24 ENCOUNTER — Telehealth: Payer: Self-pay | Admitting: Nurse Practitioner

## 2020-06-24 DIAGNOSIS — R1012 Left upper quadrant pain: Secondary | ICD-10-CM

## 2020-06-24 DIAGNOSIS — E876 Hypokalemia: Secondary | ICD-10-CM

## 2020-06-24 MED ORDER — POTASSIUM CHLORIDE ER 20 MEQ PO TBCR: 40.0000 meq | EXTENDED_RELEASE_TABLET | Freq: Every day | ORAL | 0 refills | Status: DC

## 2020-06-24 NOTE — Telephone Encounter (Signed)
Patient returned your call about results, please call patient one more time.   

## 2020-06-24 NOTE — Telephone Encounter (Signed)
See results note 06/24/20

## 2020-07-04 NOTE — Telephone Encounter (Signed)
Spoke with Dr Carolann Littler office and they asked me to fax letter again, faxed again to [phone]  Waiting on response

## 2020-07-07 ENCOUNTER — Other Ambulatory Visit (INDEPENDENT_AMBULATORY_CARE_PROVIDER_SITE_OTHER): Payer: Medicare Other

## 2020-07-07 DIAGNOSIS — E876 Hypokalemia: Secondary | ICD-10-CM | POA: Diagnosis not present

## 2020-07-07 DIAGNOSIS — R1012 Left upper quadrant pain: Secondary | ICD-10-CM | POA: Diagnosis not present

## 2020-07-07 LAB — IBC + FERRITIN
Ferritin: 15.4 ng/mL (ref 10.0–291.0)
Iron: 36 ug/dL — ABNORMAL LOW (ref 42–145)
Saturation Ratios: 8.8 % — ABNORMAL LOW (ref 20.0–50.0)
Transferrin: 291 mg/dL (ref 212.0–360.0)

## 2020-07-07 LAB — MAGNESIUM: Magnesium: 1.6 mg/dL (ref 1.5–2.5)

## 2020-07-11 ENCOUNTER — Telehealth: Payer: Self-pay

## 2020-07-11 NOTE — Telephone Encounter (Signed)
Letter faxed to [place]    Dr Josephine Igo  Phone number [phone]

## 2020-07-11 NOTE — Telephone Encounter (Signed)
  RE:      Michele Ellis DOB:   Oct 10, 1945 MRN:   [id number]   Dear Dr Josephine Igo  We have scheduled the above patient for an endoscopic procedure. Our records show that she is on anticoagulation therapy.   Please advise as to how long the patient may come off her therapy of Plavix prior to the procedure, which has been rescheduled for 07/27/2020.  Please fax back/ or route the completed form to [place] or Jan at [phone].   Sincerely,

## 2020-07-13 NOTE — Telephone Encounter (Signed)
Called to inform the patient. No answer. Left her a message asking for a return call about the Plavix and the day to stop taking it.

## 2020-07-13 NOTE — Telephone Encounter (Signed)
Spoke with the patient. The last day she will take the Plavix will be on 07/21/20. She will be instructed on when to resume the Plavix after her procedure. She expresses understanding using teach back method.

## 2020-07-13 NOTE — Telephone Encounter (Signed)
Finally received fax back, Ok to hold Plavix 5 days prior to procedure Have it documented on the request will scan in after patients procedure   Beth I have the OK up here you was looking for since you moved the patients procedure up

## 2020-07-13 NOTE — Telephone Encounter (Signed)
Beth see my phone note from 3/31

## 2020-07-27 ENCOUNTER — Encounter: Payer: Medicare Other | Admitting: Gastroenterology

## 2020-09-07 ENCOUNTER — Encounter: Payer: Medicare Other | Admitting: Gastroenterology

## 2020-09-19 ENCOUNTER — Telehealth: Payer: Self-pay

## 2020-09-19 ENCOUNTER — Other Ambulatory Visit: Payer: Self-pay

## 2020-09-19 ENCOUNTER — Encounter: Payer: Self-pay | Admitting: Gastroenterology

## 2020-09-19 ENCOUNTER — Ambulatory Visit (AMBULATORY_SURGERY_CENTER): Payer: Medicare Other | Admitting: Gastroenterology

## 2020-09-19 VITALS — BP 161/63 | HR 85 | Temp 97.7°F | Resp 17 | Ht 62.0 in | Wt 118.0 lb

## 2020-09-19 DIAGNOSIS — D125 Benign neoplasm of sigmoid colon: Secondary | ICD-10-CM | POA: Diagnosis not present

## 2020-09-19 DIAGNOSIS — D120 Benign neoplasm of cecum: Secondary | ICD-10-CM

## 2020-09-19 DIAGNOSIS — K648 Other hemorrhoids: Secondary | ICD-10-CM

## 2020-09-19 DIAGNOSIS — K259 Gastric ulcer, unspecified as acute or chronic, without hemorrhage or perforation: Secondary | ICD-10-CM

## 2020-09-19 DIAGNOSIS — D509 Iron deficiency anemia, unspecified: Secondary | ICD-10-CM | POA: Diagnosis not present

## 2020-09-19 DIAGNOSIS — R634 Abnormal weight loss: Secondary | ICD-10-CM | POA: Diagnosis not present

## 2020-09-19 DIAGNOSIS — K3189 Other diseases of stomach and duodenum: Secondary | ICD-10-CM

## 2020-09-19 DIAGNOSIS — K5730 Diverticulosis of large intestine without perforation or abscess without bleeding: Secondary | ICD-10-CM | POA: Diagnosis not present

## 2020-09-19 DIAGNOSIS — K31819 Angiodysplasia of stomach and duodenum without bleeding: Secondary | ICD-10-CM

## 2020-09-19 DIAGNOSIS — K2970 Gastritis, unspecified, without bleeding: Secondary | ICD-10-CM | POA: Diagnosis not present

## 2020-09-19 DIAGNOSIS — K200 Eosinophilic esophagitis: Secondary | ICD-10-CM | POA: Diagnosis not present

## 2020-09-19 DIAGNOSIS — K2950 Unspecified chronic gastritis without bleeding: Secondary | ICD-10-CM | POA: Diagnosis not present

## 2020-09-19 DIAGNOSIS — R1012 Left upper quadrant pain: Secondary | ICD-10-CM

## 2020-09-19 HISTORY — PX: UPPER GASTROINTESTINAL ENDOSCOPY: SHX188

## 2020-09-19 HISTORY — PX: COLONOSCOPY: SHX174

## 2020-09-19 MED ORDER — SODIUM CHLORIDE 0.9 % IV SOLN: 500.0000 mL | INTRAVENOUS | Status: DC

## 2020-09-19 MED ORDER — PANTOPRAZOLE SODIUM 40 MG PO TBEC: 40.0000 mg | DELAYED_RELEASE_TABLET | Freq: Every day | ORAL | 3 refills | Status: AC

## 2020-09-19 NOTE — Telephone Encounter (Signed)
-----   Message from Arville Lime, RN sent at 09/19/2020  1:38 PM EDT ----- Repeat EGD scheduled 12/22/20. Need clearance for Plavix. Thanks.

## 2020-09-19 NOTE — Telephone Encounter (Signed)
Michele Ellis [birth year] [id number]  Dear Dr. Josephine Igo:  We have scheduled the above named patient for a(n) endoscopic procedure. Our records show that (s)he is on anticoagulation therapy.  Please advise as to whether the patient may come off their therapy of Plavix 5 days prior to their procedure which is scheduled for Thursday, 12/22/20.  Please route your response to Gerre Couch, RN or fax response to [phone].  Sincerely,   Gerre Couch, RN

## 2020-09-19 NOTE — Telephone Encounter (Signed)
See alternate telephone encounter for cardiac clearance information.

## 2020-09-19 NOTE — Progress Notes (Signed)
A/ox3, pleased with MAC, report to RN 

## 2020-09-19 NOTE — Progress Notes (Signed)
Pt's states no medical or surgical changes since previsit or office visit. 

## 2020-09-19 NOTE — Progress Notes (Signed)
Called to room to assist during endoscopic procedure.  Patient ID and intended procedure confirmed with present staff. Received instructions for my participation in the procedure from the performing physician.  

## 2020-09-19 NOTE — Op Note (Signed)
[facility] Patient Name: Michele Ellis Procedure Date: 09/19/2020 11:56 AM MRN: [id number] Endoscopist: Remo Lipps P. Havery Moros , MD Age: 75 Referring MD:  Date of Birth: March 14, 1946 Gender: Female Account #: [id number] Procedure:                Upper GI endoscopy Indications:              Abdominal pain in the left upper quadrant, Iron                            deficiency anemia, Weight loss - on Plavix Medicines:                Monitored Anesthesia Care Procedure:                Pre-Anesthesia Assessment:                           - Prior to the procedure, a History and Physical                            was performed, and patient medications and                            allergies were reviewed. The patient's tolerance of                            previous anesthesia was also reviewed. The risks                            and benefits of the procedure and the sedation                            options and risks were discussed with the patient.                            All questions were answered, and informed consent                            was obtained. Prior Anticoagulants: The patient has                            taken Plavix (clopidogrel), last dose was 5 days                            prior to procedure. ASA Grade Assessment: II - A                            patient with mild systemic disease. After reviewing                            the risks and benefits, the patient was deemed in                            satisfactory condition to undergo the procedure.  After obtaining informed consent, the endoscope was                            passed under direct vision. Throughout the                            procedure, the patient's blood pressure, pulse, and                            oxygen saturations were monitored continuously. The                            GIF [id number] #[id number] was introduced through the                             mouth, and advanced to the second part of duodenum.                            The upper GI endoscopy was accomplished without                            difficulty. The patient tolerated the procedure                            well. Scope In: Scope Out: Findings:                 Esophagogastric landmarks were identified: the                            Z-line was found at 36 cm, the gastroesophageal                            junction was found at 36 cm and the upper extent of                            the gastric folds was found at 36 cm from the                            incisors.                           Focal mild esophagitis was found 36 cm from the                            incisors, with some associated nodularity. I                            suspect inflammatory changes only but biopsies were                            taken with a cold forceps for histology.                           The  exam of the esophagus was otherwise normal.                           One cratered gastric ulcer was found at the                            pylorus. The lesion was 2-3 mm in largest                            dimension, but the surrounding mucosa was nodular.                            Biopsies were taken with a cold forceps for                            histology.                           One non-bleeding cratered gastric ulcer with no                            stigmata of bleeding was found in the proximal                            greater curvature of the gastric body. The lesion                            was 3 mm in largest dimension.                           Diffuse atrophic mucosa was found in the entire                            examined stomach with erythema.                           The exam of the stomach was otherwise normal.                           Biopsies were taken with a cold forceps in the                            gastric body, at the incisura and in the  gastric                            antrum for Helicobacter pylori testing.                           A few small angiodysplastic lesions without                            bleeding were found in the second portion of the  duodenum.                           The exam of the duodenum was otherwise normal.                           Biopsies for histology were taken with a cold                            forceps in the duodenal bulb and in the second                            portion of the duodenum to rule out celiac disease. Complications:            No immediate complications. Estimated blood loss:                            Minimal. Estimated Blood Loss:     Estimated blood loss was minimal. Impression:               - Esophagogastric landmarks identified.                           - Mild esophagitis. Biopsied.                           - Gastric ulcer in the pyloric channel with                            nodularity. Biopsied.                           - Non-bleeding gastric ulcer with no stigmata of                            bleeding in the gastric body.                           - Gastric mucosal atrophy with gastritis.                           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                           - Biopsies were taken with a cold forceps for                            Helicobacter pylori testing.                           - Biopsies were taken with a cold forceps for                            evaluation of  celiac disease.                           Suspect gastric ulcers / gastritis is likely                            causing IDA, possible that AVMs could be                            contributing in the setting of Plavix. Recommendation:           - Patient has a contact number available for                            emergencies. The signs and symptoms of potential                             delayed complications were discussed with the                            patient. Return to normal activities tomorrow.                            Written discharge instructions were provided to the                            patient.                           - Resume previous diet.                           - Continue present medications, continue iron                            supplementation                           - Resume Plavix tomorrow (colon polyp removed                            during colonoscopy)                           - Start protonix 40mg  once daily                           - Avoid other NSAIDs                           - Await pathology results.                           - Will plan on repeat CBC in a few weeks to make                            sure stable                           -  Will need a repeat EGD to ensure mucosal healing                            of ulcers in a few months Remo Lipps P. Michele Kienitz, MD 09/19/2020 12:50:45 PM This report has been signed electronically.

## 2020-09-19 NOTE — Telephone Encounter (Signed)
Cardiac clearance request faxed to Dr. Mallie Darting at [facility] in [place].  P: [phone] F: [phone]

## 2020-09-19 NOTE — Op Note (Addendum)
[facility] Patient Name: Michele Ellis Procedure Date: 09/19/2020 11:55 AM MRN: [id number] Endoscopist: Remo Lipps P. Havery Moros , MD Age: 75 Referring MD:  Date of Birth: May 09, 1945 Gender: Female Account #: [id number] Procedure:                Colonoscopy Indications:              Iron deficiency anemia, Weight loss - no prior                            colonoscopy, history of Plavix use Medicines:                Monitored Anesthesia Care Procedure:                Pre-Anesthesia Assessment:                           - Prior to the procedure, a History and Physical                            was performed, and patient medications and                            allergies were reviewed. The patient's tolerance of                            previous anesthesia was also reviewed. The risks                            and benefits of the procedure and the sedation                            options and risks were discussed with the patient.                            All questions were answered, and informed consent                            was obtained. Prior Anticoagulants: The patient has                            taken Plavix (clopidogrel), last dose was 5 days                            prior to procedure. ASA Grade Assessment: II - A                            patient with mild systemic disease. After reviewing                            the risks and benefits, the patient was deemed in                            satisfactory condition to undergo the procedure.  After obtaining informed consent, the colonoscope                            was passed under direct vision. Throughout the                            procedure, the patient's blood pressure, pulse, and                            oxygen saturations were monitored continuously. The                            Olympus PCF-H190DL (#[id number]) Colonoscope was                            introduced  through the anus and advanced to the the                            terminal ileum, with identification of the                            appendiceal orifice and IC valve. The colonoscopy                            was performed without difficulty. The patient                            tolerated the procedure well. The quality of the                            bowel preparation was adequate. The terminal ileum,                            ileocecal valve, appendiceal orifice, and rectum                            were photographed. Scope In: 12:13:11 PM Scope Out: 12:36:20 PM Scope Withdrawal Time: 0 hours 19 minutes 26 seconds  Total Procedure Duration: 0 hours 23 minutes 9 seconds  Findings:                 The perianal and digital rectal examinations were                            normal.                           The terminal ileum contained a few diminutive                            angiodysplastic lesions.                           A diminutive polyp was found in the cecum. The  polyp was sessile. The polyp was removed with a                            cold biopsy forceps. Resection and retrieval were                            complete.                           Multiple medium-mouthed diverticula were found in                            the entire colon.                           A 8 to 10 mm polyp was found in the sigmoid colon.                            The polyp was sessile. The polyp was removed with a                            cold snare. Resection and retrieval were complete.                           Internal hemorrhoids were found during                            retroflexion. The hemorrhoids were small.                           The exam was otherwise without abnormality. Complications:            No immediate complications. Estimated blood loss:                            Minimal. Estimated Blood Loss:     Estimated blood loss was  minimal. 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                           - One diminutive polyp in the cecum, removed with a                            cold biopsy forceps. Resected and retrieved.                           - Diverticulosis in the entire examined colon.                           - One 8 to 10 mm polyp in the sigmoid colon,                            removed with a cold snare. Resected and retrieved.                           -  Internal hemorrhoids.                           - The examination was otherwise normal.                           Suspect IDA mostly coming from gastric ulcers /                            gastritis, possible component of AVMs of the small                            bowel as well. Recommendation:           - Patient has a contact number available for                            emergencies. The signs and symptoms of potential                            delayed complications were discussed with the                            patient. Return to normal activities tomorrow.                            Written discharge instructions were provided to the                            patient.                           - Resume previous diet.                           - Continue present medications.                           - Resume Plavix tomorrow                           - Await pathology results.                           - Other recommendations per EGD note Rileigh Kawashima P. Eliyahu Bille, MD 09/19/2020 12:42:04 PM This report has been signed electronically.

## 2020-09-19 NOTE — Patient Instructions (Addendum)
Handouts on polys, diverticulosis, and gastritis given to you today  Await pathology results from Dr. Havery Moros  Resume your plavix tomorrow, Tuesday 09/20/20 Avoid NSAIDs (ibuprofen, advil, aleve, BC/goody powders); tylenol is okay to take   [place]:   Refer to the procedure report that was given to you for any specific questions about what was found during the examination.  If the procedure report does not answer your questions, please call your gastroenterologist to clarify.  If you requested that your care partner not be given the details of your procedure findings, then the procedure report has been included in a sealed envelope for you to review at your convenience later.  YOU SHOULD EXPECT: Some feelings of bloating in the abdomen. Passage of more gas than usual.  Walking can help get rid of the air that was put into your GI tract during the procedure and reduce the bloating. If you had a lower endoscopy (such as a colonoscopy or flexible sigmoidoscopy) you may notice spotting of blood in your stool or on the toilet paper. If you underwent a bowel prep for your procedure, you may not have a normal bowel movement for a few days.  Please Note:  You might notice some irritation and congestion in your nose or some drainage.  This is from the oxygen used during your procedure.  There is no need for concern and it should clear up in a day or so.  SYMPTOMS TO REPORT IMMEDIATELY:  Following lower endoscopy (colonoscopy or flexible sigmoidoscopy):  Excessive amounts of blood in the stool  Significant tenderness or worsening of abdominal pains  Swelling of the abdomen that is new, acute  Fever of 100F or higher  Following upper endoscopy (EGD)  Vomiting of blood or coffee ground material  New chest pain or pain under the shoulder blades  Painful or persistently difficult swallowing  New shortness of breath  Fever of 100F or  higher  Black, tarry-looking stools  For urgent or emergent issues, a gastroenterologist can be reached at any hour by calling [phone]. Do not use MyChart messaging for urgent concerns.    DIET:  We do recommend a small meal at first, but then you may proceed to your regular diet.  Drink plenty of fluids but you should avoid alcoholic beverages for 24 hours.  ACTIVITY:  You should plan to take it easy for the rest of today and you should NOT DRIVE or use heavy machinery until tomorrow (because of the sedation medicines used during the test).    FOLLOW UP: Our staff will call the number listed on your records 48-72 hours following your procedure to check on you and address any questions or concerns that you may have regarding the information given to you following your procedure. If we do not reach you, we will leave a message.  We will attempt to reach you two times.  During this call, we will ask if you have developed any symptoms of COVID 19. If you develop any symptoms (ie: fever, flu-like symptoms, shortness of breath, cough etc.) before then, please call [phone].  If you test positive for Covid 19 in the 2 weeks post procedure, please call and report this information to [place].    If any biopsies were taken you will be contacted by phone or by letter within the next 1-3 weeks.  Please call us at [phone] if you have not heard about the biopsies in  3 weeks.    SIGNATURES/CONFIDENTIALITY: You and/or your care partner have signed paperwork which will be entered into your electronic medical record.  These signatures attest to the fact that that the information above on your After Visit Summary has been reviewed and is understood.  Full responsibility of the confidentiality of this discharge information lies with you and/or your care-partner.

## 2020-09-21 ENCOUNTER — Telehealth: Payer: Self-pay

## 2020-09-21 ENCOUNTER — Telehealth: Payer: Self-pay | Admitting: *Deleted

## 2020-09-21 NOTE — Telephone Encounter (Signed)
  Follow up Call-  Call back number 09/19/2020  Post procedure Call Back phone  # [phone]  Permission to leave phone message Yes  Some recent data might be hidden     Patient questions:  Do you have a fever, pain , or abdominal swelling? No. Pain Score  0 *  Have you tolerated food without any problems? Yes.    Have you been able to return to your normal activities? Yes.    Do you have any questions about your discharge instructions: Diet   No. Medications  No. Follow up visit  No.  Do you have questions or concerns about your Care? No.  Actions: No action, pain less than a 4.

## 2020-09-21 NOTE — Telephone Encounter (Signed)
Left message on f/u call 

## 2020-09-23 ENCOUNTER — Other Ambulatory Visit: Payer: Self-pay

## 2020-09-23 ENCOUNTER — Telehealth: Payer: Self-pay | Admitting: Gastroenterology

## 2020-09-23 DIAGNOSIS — D509 Iron deficiency anemia, unspecified: Secondary | ICD-10-CM

## 2020-09-23 NOTE — Telephone Encounter (Signed)
Patient daughter returning missed call. Please retutn call [phone].Marland Kitchen Plz advise thanks

## 2020-09-23 NOTE — Telephone Encounter (Signed)
See result note.  

## 2020-10-03 NOTE — Telephone Encounter (Signed)
Called [facility] to follow up on cardiac clearance request. Spoke with [place], she states that they do not have the request although I have a confirmation of receipt fax. Advised [place] that I will fax again, confirmed fax number.

## 2020-10-04 NOTE — Telephone Encounter (Signed)
Received a return fax from [facility], OK to hold Plavix for 5 days. A copy of the letter has been sent to be scanned into patient's chart, I will keep a copy as well.

## 2020-10-13 ENCOUNTER — Other Ambulatory Visit (INDEPENDENT_AMBULATORY_CARE_PROVIDER_SITE_OTHER): Payer: Medicare Other

## 2020-10-13 DIAGNOSIS — D509 Iron deficiency anemia, unspecified: Secondary | ICD-10-CM

## 2020-10-13 LAB — CBC WITH DIFFERENTIAL/PLATELET
Basophils Absolute: 0.2 10*3/uL — ABNORMAL HIGH (ref 0.0–0.1)
Basophils Relative: 1.4 % (ref 0.0–3.0)
Eosinophils Absolute: 0.8 10*3/uL — ABNORMAL HIGH (ref 0.0–0.7)
Eosinophils Relative: 6.2 % — ABNORMAL HIGH (ref 0.0–5.0)
HCT: 32 % — ABNORMAL LOW (ref 36.0–46.0)
Hemoglobin: 10.4 g/dL — ABNORMAL LOW (ref 12.0–15.0)
Lymphocytes Relative: 20.8 % (ref 12.0–46.0)
Lymphs Abs: 2.5 10*3/uL (ref 0.7–4.0)
MCHC: 32.5 g/dL (ref 30.0–36.0)
MCV: 77.7 fl — ABNORMAL LOW (ref 78.0–100.0)
Monocytes Absolute: 0.6 10*3/uL (ref 0.1–1.0)
Monocytes Relative: 5 % (ref 3.0–12.0)
Neutro Abs: 8.1 10*3/uL — ABNORMAL HIGH (ref 1.4–7.7)
Neutrophils Relative %: 66.6 % (ref 43.0–77.0)
Platelets: 294 10*3/uL (ref 150.0–400.0)
RBC: 4.12 Mil/uL (ref 3.87–5.11)
RDW: 17.2 % — ABNORMAL HIGH (ref 11.5–15.5)
WBC: 12.2 10*3/uL — ABNORMAL HIGH (ref 4.0–10.5)

## 2020-10-17 ENCOUNTER — Other Ambulatory Visit: Payer: Self-pay

## 2020-10-17 DIAGNOSIS — D509 Iron deficiency anemia, unspecified: Secondary | ICD-10-CM

## 2020-10-31 ENCOUNTER — Other Ambulatory Visit: Payer: Self-pay

## 2020-10-31 DIAGNOSIS — R634 Abnormal weight loss: Secondary | ICD-10-CM

## 2020-10-31 DIAGNOSIS — R109 Unspecified abdominal pain: Secondary | ICD-10-CM

## 2020-10-31 DIAGNOSIS — R6881 Early satiety: Secondary | ICD-10-CM

## 2020-10-31 DIAGNOSIS — R1012 Left upper quadrant pain: Secondary | ICD-10-CM

## 2020-11-07 ENCOUNTER — Encounter (HOSPITAL_BASED_OUTPATIENT_CLINIC_OR_DEPARTMENT_OTHER): Payer: Self-pay

## 2020-11-07 ENCOUNTER — Other Ambulatory Visit: Payer: Self-pay

## 2020-11-07 ENCOUNTER — Ambulatory Visit (HOSPITAL_BASED_OUTPATIENT_CLINIC_OR_DEPARTMENT_OTHER)
Admission: RE | Admit: 2020-11-07 | Discharge: 2020-11-07 | Disposition: A | Discharge status: Home / Self Care | Payer: Medicare Other | Source: Ambulatory Visit | Attending: Nurse Practitioner | Admitting: Nurse Practitioner

## 2020-11-07 DIAGNOSIS — R109 Unspecified abdominal pain: Secondary | ICD-10-CM | POA: Diagnosis present

## 2020-11-07 DIAGNOSIS — R6881 Early satiety: Secondary | ICD-10-CM | POA: Insufficient documentation

## 2020-11-07 DIAGNOSIS — R634 Abnormal weight loss: Secondary | ICD-10-CM | POA: Diagnosis present

## 2020-11-07 LAB — POCT I-STAT CREATININE: Creatinine, Ser: 1 mg/dL (ref 0.44–1.00)

## 2020-11-07 MED ORDER — IOHEXOL 350 MG/ML SOLN
50.0000 mL | Freq: Once | INTRAVENOUS | Status: AC | PRN
  Administered 2020-11-07: 50 mL via INTRAVENOUS

## 2020-12-12 ENCOUNTER — Ambulatory Visit (AMBULATORY_SURGERY_CENTER): Payer: Self-pay | Admitting: *Deleted

## 2020-12-12 ENCOUNTER — Other Ambulatory Visit: Payer: Self-pay

## 2020-12-12 ENCOUNTER — Other Ambulatory Visit (INDEPENDENT_AMBULATORY_CARE_PROVIDER_SITE_OTHER): Payer: Medicare Other

## 2020-12-12 VITALS — Ht 62.0 in | Wt 100.0 lb

## 2020-12-12 DIAGNOSIS — D509 Iron deficiency anemia, unspecified: Secondary | ICD-10-CM

## 2020-12-12 DIAGNOSIS — K253 Acute gastric ulcer without hemorrhage or perforation: Secondary | ICD-10-CM

## 2020-12-12 LAB — CBC WITH DIFFERENTIAL/PLATELET
Basophils Absolute: 0.3 10*3/uL — ABNORMAL HIGH (ref 0.0–0.1)
Basophils Relative: 3.3 % — ABNORMAL HIGH (ref 0.0–3.0)
Eosinophils Absolute: 0.5 10*3/uL (ref 0.0–0.7)
Eosinophils Relative: 5.3 % — ABNORMAL HIGH (ref 0.0–5.0)
HCT: 32.6 % — ABNORMAL LOW (ref 36.0–46.0)
Hemoglobin: 10.6 g/dL — ABNORMAL LOW (ref 12.0–15.0)
Lymphocytes Relative: 30.8 % (ref 12.0–46.0)
Lymphs Abs: 2.8 10*3/uL (ref 0.7–4.0)
MCHC: 32.4 g/dL (ref 30.0–36.0)
MCV: 81.5 fl (ref 78.0–100.0)
Monocytes Absolute: 0.6 10*3/uL (ref 0.1–1.0)
Monocytes Relative: 6.3 % (ref 3.0–12.0)
Neutro Abs: 4.9 10*3/uL (ref 1.4–7.7)
Neutrophils Relative %: 54.3 % (ref 43.0–77.0)
Platelets: 300 10*3/uL (ref 150.0–400.0)
RBC: 4 Mil/uL (ref 3.87–5.11)
RDW: 17.5 % — ABNORMAL HIGH (ref 11.5–15.5)
WBC: 9 10*3/uL (ref 4.0–10.5)

## 2020-12-12 NOTE — Progress Notes (Signed)
Patient and daughter is here in-person for PV. Patient denies any allergies to eggs or soy. Patient denies any problems with anesthesia/sedation. Patient is not on any oxygen at home. Patient is not taking any diet/weight loss medications or blood thinners. Patient is aware of our care-partner policy and 0000000 safety protocol.   EMMI education assigned to the patient for the procedure, sent to [place].

## 2020-12-13 ENCOUNTER — Other Ambulatory Visit: Payer: Self-pay

## 2020-12-13 DIAGNOSIS — D509 Iron deficiency anemia, unspecified: Secondary | ICD-10-CM

## 2020-12-13 NOTE — Progress Notes (Signed)
Patient due for CBC and IBC/Ferritin in 6 weeks

## 2020-12-22 ENCOUNTER — Ambulatory Visit (AMBULATORY_SURGERY_CENTER): Payer: Medicare Other | Admitting: Gastroenterology

## 2020-12-22 ENCOUNTER — Encounter: Payer: Self-pay | Admitting: Gastroenterology

## 2020-12-22 ENCOUNTER — Other Ambulatory Visit: Payer: Self-pay

## 2020-12-22 VITALS — BP 144/61 | HR 76 | Temp 96.6°F | Resp 13 | Ht 62.0 in | Wt 100.0 lb

## 2020-12-22 DIAGNOSIS — K31819 Angiodysplasia of stomach and duodenum without bleeding: Secondary | ICD-10-CM | POA: Diagnosis not present

## 2020-12-22 DIAGNOSIS — K259 Gastric ulcer, unspecified as acute or chronic, without hemorrhage or perforation: Secondary | ICD-10-CM | POA: Diagnosis not present

## 2020-12-22 DIAGNOSIS — K2950 Unspecified chronic gastritis without bleeding: Secondary | ICD-10-CM | POA: Diagnosis not present

## 2020-12-22 DIAGNOSIS — K2970 Gastritis, unspecified, without bleeding: Secondary | ICD-10-CM | POA: Diagnosis not present

## 2020-12-22 MED ORDER — SODIUM CHLORIDE 0.9 % IV SOLN: 500.0000 mL | INTRAVENOUS | Status: DC

## 2020-12-22 NOTE — Patient Instructions (Addendum)
Please read handouts provided. Continue present medications. Await pathology results. Continue protonix. Check iron levels in a few weeks.   YOU HAD AN ENDOSCOPIC PROCEDURE TODAY AT [facility]:   Refer to the procedure report that was given to you for any specific questions about what was found during the examination.  If the procedure report does not answer your questions, please call your gastroenterologist to clarify.  If you requested that your care partner not be given the details of your procedure findings, then the procedure report has been included in a sealed envelope for you to review at your convenience later.  YOU SHOULD EXPECT: Some feelings of bloating in the abdomen. Passage of more gas than usual.  Walking can help get rid of the air that was put into your GI tract during the procedure and reduce the bloating. If you had a lower endoscopy (such as a colonoscopy or flexible sigmoidoscopy) you may notice spotting of blood in your stool or on the toilet paper. If you underwent a bowel prep for your procedure, you may not have a normal bowel movement for a few days.  Please Note:  You might notice some irritation and congestion in your nose or some drainage.  This is from the oxygen used during your procedure.  There is no need for concern and it should clear up in a day or so.  SYMPTOMS TO REPORT IMMEDIATELY:    Following upper endoscopy (EGD)  Vomiting of blood or coffee ground material  New chest pain or pain under the shoulder blades  Painful or persistently difficult swallowing  New shortness of breath  Fever of 100F or higher  Black, tarry-looking stools  For urgent or emergent issues, a gastroenterologist can be reached at any hour by calling [phone]. Do not use MyChart messaging for urgent concerns.    DIET:  We do recommend a small meal at first, but then you may proceed to your regular diet.  Drink plenty of fluids but you should avoid  alcoholic beverages for 24 hours.  ACTIVITY:  You should plan to take it easy for the rest of today and you should NOT DRIVE or use heavy machinery until tomorrow (because of the sedation medicines used during the test).    FOLLOW UP: Our staff will call the number listed on your records 48-72 hours following your procedure to check on you and address any questions or concerns that you may have regarding the information given to you following your procedure. If we do not reach you, we will leave a message.  We will attempt to reach you two times.  During this call, we will ask if you have developed any symptoms of COVID 19. If you develop any symptoms (ie: fever, flu-like symptoms, shortness of breath, cough etc.) before then, please call [phone].  If you test positive for Covid 19 in the 2 weeks post procedure, please call and report this information to [place].    If any biopsies were taken you will be contacted by phone or by letter within the next 1-3 weeks.  Please call us at [phone] if you have not heard about the biopsies in 3 weeks.    SIGNATURES/CONFIDENTIALITY: You and/or your care partner have signed paperwork which will be entered into your electronic medical record.  These signatures attest to the fact that that the information above on your After Visit Summary has been reviewed and is understood.  Full responsibility of the confidentiality of this  discharge information lies with you and/or your care-partner.  

## 2020-12-22 NOTE — Progress Notes (Signed)
Called to room to assist during endoscopic procedure.  Patient ID and intended procedure confirmed with present staff. Received instructions for my participation in the procedure from the performing physician.  

## 2020-12-22 NOTE — Op Note (Signed)
[place] Patient Name: Taquisha Phung Procedure Date: 12/22/2020 9:25 AM MRN: [id number] Endoscopist: Remo Lipps P. Havery Moros , MD Age: 75 Referring MD:  Date of Birth: Jun 09, 1945 Gender: Female Account #: [id number] Procedure:                Upper GI endoscopy Indications:              Follow-up of gastric ulcers - history of iron                            deficiency thought to be due to ulcers, previously                            on Plavix but patient stopped it, currently on                            aspirin 325mg  / day and protonix 40mg  / day. Anemia                            persists, patient was intolerant of higher dose                            oral iron, on low dose once daily at this time Medicines:                Monitored Anesthesia Care Procedure:                Pre-Anesthesia Assessment:                           - Prior to the procedure, a History and Physical                            was performed, and patient medications and                            allergies were reviewed. The patient's tolerance of                            previous anesthesia was also reviewed. The risks                            and benefits of the procedure and the sedation                            options and risks were discussed with the patient.                            All questions were answered, and informed consent                            was obtained. Prior Anticoagulants: The patient has                            taken no previous anticoagulant or antiplatelet  agents. ASA Grade Assessment: II - A patient with                            mild systemic disease. After reviewing the risks                            and benefits, the patient was deemed in                            satisfactory condition to undergo the procedure.                           After obtaining informed consent, the endoscope was                             passed under direct vision. Throughout the                            procedure, the patient's blood pressure, pulse, and                            oxygen saturations were monitored continuously. The                            GIF [id number] #[id number] was introduced through the                            mouth, and advanced to the second part of duodenum.                            The upper GI endoscopy was accomplished without                            difficulty. The patient tolerated the procedure                            well. Scope In: Scope Out: Findings:                 Esophagogastric landmarks were identified: the                            Z-line was found at 38 cm, the gastroesophageal                            junction was found at 38 cm and the upper extent of                            the gastric folds was found at 37 cm from the                            incisors.                           A  1 cm hiatal hernia was present.                           A single benign appearing vascular bleb was found                            in the esophagus, 20 cm from the incisors.                           The exam of the esophagus was otherwise normal.                           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                           Diffuse atrophic mucosa was found in the entire                            examined stomach. Biopsies were taken with a cold                            forceps for Helicobacter pylori testing.                           The exam of the stomach was otherwise normal.                            Interval healing of gastric ulcers                           Two small angiodysplastic lesions were found in the                            second portion of the duodenum.                           The exam of  the duodenum was otherwise normal. Complications:            No immediate complications. Estimated blood loss:                            Minimal. Estimated Blood Loss:     Estimated blood loss was minimal. Impression:               - Esophagogastric landmarks identified.                           - 1 cm hiatal hernia.                           -  Benign vascular bleb found in the esophagus.                           - Normal esophagus otherwise                           - Two non-bleeding angiodysplastic lesions in the                            stomach.                           - Gastric mucosal atrophy. Biopsied.                           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                           Ulcers have healed but anemia persists, unclear if                            due to AVMs at this point. Recommend repeat iron                            studies in a few weeks and if persists despite low                            dose oral supplementation, may need IV iron infusion Recommendation:           - Patient has a contact number available for                            emergencies. The signs and symptoms of potential                            delayed complications were discussed with the                            patient. Return to normal activities tomorrow.                            Written discharge instructions were provided to the                            patient.                           - Resume previous diet.                           - Continue present medications.                           -  Continue protonix given history of ulcers and                            regular aspirin use                           - Await pathology results.                           - Check iron studies in a few weeks, if deficiency                            persists consider IV Iron Remo Lipps P. Shawn Carattini,  MD 12/22/2020 9:46:15 AM This report has been signed electronically.

## 2020-12-22 NOTE — Progress Notes (Signed)
A and O x3. Report to RN. Tolerated MAC anesthesia well.Teeth unchanged after procedure. 

## 2020-12-22 NOTE — Progress Notes (Signed)
[facility] History and Physical   Primary Care Physician:  Rory Percy, MD   Reason for Procedure:   History of gastric ulcers, anemia  Plan:    EGD     HPI: Michele Ellis is a 75 y.o. female  here for surveillance EGD to confirm mucosal healing of gastric ulcers. Previously on Plavix with IDA - found to have gastric ulcers. Treated with protonix. Off PLavix now but on regular aspirin. Anemia has not resolved, Hgb in 10s.. Patient denies any bowel symptoms at this time. Otherwise feels well without any cardiopulmonary symptoms.    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--balance is off    Past Surgical History:  Procedure Laterality Date   CAROTID ANGIOGRAM N/A 01/02/2013   Procedure: CAROTID ANGIOGRAM;  Surgeon: Elam Dutch, MD;  Location: [facility] CATH LAB;  Service: Cardiovascular;  Laterality: N/A;   CAROTID ENDARTERECTOMY Right 02/11/2013   CE   CHOLECYSTECTOMY     COLONOSCOPY  09/19/2020   Dr.Vinton Layson   ENDARTERECTOMY Right 02/11/2013   Procedure: ENDARTERECTOMY CAROTID;  Surgeon: Elam Dutch, MD;  Location: [facility];  Service: Vascular;  Laterality: Right;   FRACTURE SURGERY     RT arm   PATCH ANGIOPLASTY Right 02/11/2013   Procedure: PATCH ANGIOPLASTY OF RIGHT CAROTID ARTERY  USING HEMASHIELD PLATINUM FINESSE PATCH;  Surgeon: Elam Dutch, MD;  Location: [facility] [place];  Service: Vascular;  Laterality: Right;   rectal abscess     perirectal   RIGHT ARM     SURGERY, ALSO HAS METAL PLATE   UPPER GASTROINTESTINAL ENDOSCOPY  09/19/2020   Dr.Halston Fairclough    Prior to Admission medications   Medication Sig Start Date End Date Taking? Authorizing Provider  amLODipine (NORVASC) 10 MG tablet Take 10 mg by mouth daily.   Yes [provider]  aspirin 325 MG EC tablet Take 325 mg by mouth daily.   Yes [provider]   hydrochlorothiazide (HYDRODIURIL) 25 MG tablet Take 25 mg by mouth daily. 03/18/20  Yes [provider]  lisinopril (ZESTRIL) 40 MG tablet Take 40 mg by mouth daily. 03/18/20  Yes [provider]  pantoprazole (PROTONIX) 40 MG tablet Take 1 tablet (40 mg total) by mouth daily. 09/19/20  Yes Apryle Stowell, Carlota Raspberry, MD  indomethacin (INDOCIN) 25 MG capsule Take 25 mg by mouth 2 (two) times daily with a meal.    [provider]  Polyethyl Glycol-Propyl Glycol 0.4-0.3 % SOLN Apply 1-2 drops to eye daily as needed (for dry eyes).    [provider]    Current Outpatient Medications  Medication Sig Dispense Refill   amLODipine (NORVASC) 10 MG tablet Take 10 mg by mouth daily.     aspirin 325 MG EC tablet Take 325 mg by mouth daily.     hydrochlorothiazide (HYDRODIURIL) 25 MG tablet Take 25 mg by mouth daily.     lisinopril (ZESTRIL) 40 MG tablet Take 40 mg by mouth daily.     pantoprazole (PROTONIX) 40 MG tablet Take 1 tablet (40 mg total) by mouth daily. 90 tablet 3   indomethacin (INDOCIN) 25 MG capsule Take 25 mg by mouth 2 (two) times daily with a meal.     Polyethyl Glycol-Propyl Glycol 0.4-0.3 % SOLN Apply 1-2 drops to eye daily as needed (for dry eyes).     Current Facility-Administered  Medications  Medication Dose Route Frequency Provider Last Rate Last Admin   0.9 %  sodium chloride infusion  500 mL Intravenous Continuous English Craighead, Carlota Raspberry, MD        Allergies as of 12/22/2020 - Review Complete 12/22/2020  Allergen Reaction Noted   Lipitor [atorvastatin] Other (See Comments) 09/19/2020   Doxycycline Rash 11/14/2012    Family History  Problem Relation Age of Onset   Cancer Father        Lung- Black Lung   Colon cancer Neg Hx    Esophageal cancer Neg Hx     Social History   Socioeconomic History   Marital status: Single    Spouse name: Not on file   Number of children: Not on file   Years of education: Not on file   Highest education  level: Not on file  Occupational History   Not on file  Tobacco Use   Smoking status: Former    Packs/day: 1.00    Years: 25.00    Pack years: 25.00    Types: Cigarettes    Quit date: 03/26/1994    Years since quitting: 26.7   Smokeless tobacco: Never  Vaping Use   Vaping Use: Never used  Substance and Sexual Activity   Alcohol use: No    Alcohol/week: 0.0 standard drinks   Drug use: No   Sexual activity: Not on file  Other Topics Concern   Not on file  Social History Narrative   Not on file   Social Determinants of Health   Financial Resource Strain: Not on file  Food Insecurity: Not on file  Transportation Needs: Not on file  Physical Activity: Not on file  Stress: Not on file  Social Connections: Not on file  Intimate Partner Violence: Not on file    Review of Systems: All other review of systems negative except as mentioned in the HPI.  Physical Exam: Vital signs BP (!) 149/72   Pulse 79   Temp (!) 96.6 F (35.9 C) (Temporal)   Resp 11   Ht 5\' 2"  (1.575 m)   Wt 100 lb (45.4 kg)   SpO2 98%   BMI 18.29 kg/m   General:   Alert,  Well-developed, well-nourished, pleasant and cooperative in NAD Lungs:  Clear throughout to auscultation.   Heart:  Regular rate and rhythm Abdomen:  Soft, nontender and nondistended.   Neuro/Psych:  Alert and cooperative. Normal mood and affect. A and O x 3  Jolly Mango, MD [facility]

## 2020-12-26 ENCOUNTER — Telehealth: Payer: Self-pay | Admitting: *Deleted

## 2020-12-26 ENCOUNTER — Telehealth: Payer: Self-pay

## 2020-12-26 NOTE — Telephone Encounter (Signed)
No answer, left message to call back later today, B.Terianna Peggs RN. 

## 2020-12-26 NOTE — Telephone Encounter (Signed)
  Follow up Call-  Call back number 12/22/2020 09/19/2020  Post procedure Call Back phone  # [phone]  Permission to leave phone message Yes Yes  Some recent data might be hidden     Patient questions:  Do you have a fever, pain , or abdominal swelling? No. Pain Score  0 *  Have you tolerated food without any problems? Yes.    Have you been able to return to your normal activities? Yes.    Do you have any questions about your discharge instructions: Diet   No. Medications  No. Follow up visit  No.  Do you have questions or concerns about your Care? No.  Actions: * If pain score is 4 or above: No action needed, pain <4.Have you developed a fever since your procedure? no  2.   Have you had an respiratory symptoms (SOB or cough) since your procedure? no  3.   Have you tested positive for COVID 19 since your procedure no  4.   Have you had any family members/close contacts diagnosed with the COVID 19 since your procedure?  no   If yes to any of these questions please route to Joylene John, RN and Joella Prince, RN

## 2020-12-29 ENCOUNTER — Other Ambulatory Visit: Payer: Self-pay

## 2020-12-29 DIAGNOSIS — K259 Gastric ulcer, unspecified as acute or chronic, without hemorrhage or perforation: Secondary | ICD-10-CM

## 2020-12-29 DIAGNOSIS — K2970 Gastritis, unspecified, without bleeding: Secondary | ICD-10-CM

## 2020-12-29 DIAGNOSIS — K31819 Angiodysplasia of stomach and duodenum without bleeding: Secondary | ICD-10-CM

## 2020-12-29 DIAGNOSIS — D509 Iron deficiency anemia, unspecified: Secondary | ICD-10-CM

## 2020-12-29 NOTE — Progress Notes (Signed)
Per Dr. Havery Moros patient needs CBC and IBC/Ferritin and knows to go to the lab in 2 weeks

## 2021-01-17 ENCOUNTER — Telehealth: Payer: Self-pay

## 2021-01-17 NOTE — Telephone Encounter (Signed)
-----   Message from Roetta Sessions, [place] sent at 12/13/2020  8:03 AM EDT ----- Regarding: due for labs Patient due for cbc and ibc/ferritin next week

## 2021-01-17 NOTE — Telephone Encounter (Signed)
Called and left message for patient to go to the lab one day this week or next for cbc and ibc/ferritin.

## 2021-03-19 ENCOUNTER — Encounter (HOSPITAL_COMMUNITY): Payer: Self-pay

## 2021-03-19 ENCOUNTER — Other Ambulatory Visit: Payer: Self-pay

## 2021-03-19 ENCOUNTER — Emergency Department (HOSPITAL_COMMUNITY)
Admission: EM | Admit: 2021-03-19 | Discharge: 2021-03-26 | Disposition: E | Payer: Medicare Other | Attending: Emergency Medicine | Admitting: Emergency Medicine

## 2021-03-19 DIAGNOSIS — K55029 Acute infarction of small intestine, extent unspecified: Secondary | ICD-10-CM | POA: Diagnosis not present

## 2021-03-19 DIAGNOSIS — E876 Hypokalemia: Secondary | ICD-10-CM | POA: Diagnosis not present

## 2021-03-19 DIAGNOSIS — Z87891 Personal history of nicotine dependence: Secondary | ICD-10-CM | POA: Diagnosis not present

## 2021-03-19 DIAGNOSIS — D649 Anemia, unspecified: Secondary | ICD-10-CM | POA: Insufficient documentation

## 2021-03-19 DIAGNOSIS — Z7982 Long term (current) use of aspirin: Secondary | ICD-10-CM | POA: Insufficient documentation

## 2021-03-19 DIAGNOSIS — Z20822 Contact with and (suspected) exposure to covid-19: Secondary | ICD-10-CM | POA: Insufficient documentation

## 2021-03-19 DIAGNOSIS — K559 Vascular disorder of intestine, unspecified: Secondary | ICD-10-CM | POA: Diagnosis not present

## 2021-03-19 DIAGNOSIS — K55049 Acute infarction of large intestine, extent unspecified: Secondary | ICD-10-CM | POA: Diagnosis not present

## 2021-03-19 DIAGNOSIS — D72829 Elevated white blood cell count, unspecified: Secondary | ICD-10-CM | POA: Diagnosis not present

## 2021-03-19 DIAGNOSIS — I10 Essential (primary) hypertension: Secondary | ICD-10-CM | POA: Diagnosis not present

## 2021-03-19 DIAGNOSIS — N179 Acute kidney failure, unspecified: Secondary | ICD-10-CM | POA: Diagnosis not present

## 2021-03-19 DIAGNOSIS — Z79899 Other long term (current) drug therapy: Secondary | ICD-10-CM | POA: Diagnosis not present

## 2021-03-19 DIAGNOSIS — R531 Weakness: Secondary | ICD-10-CM | POA: Diagnosis present

## 2021-03-19 MED ORDER — MORPHINE SULFATE (PF) 4 MG/ML IV SOLN
4.0000 mg | Freq: Once | INTRAVENOUS | Status: AC
  Administered 2021-03-20: 01:00:00 4 mg via INTRAVENOUS
  Filled 2021-03-19: qty 1

## 2021-03-19 MED ORDER — ONDANSETRON HCL 4 MG/2ML IJ SOLN
4.0000 mg | Freq: Once | INTRAMUSCULAR | Status: AC
  Administered 2021-03-20: 01:00:00 4 mg via INTRAVENOUS
  Filled 2021-03-19: qty 2

## 2021-03-19 MED ORDER — LACTATED RINGERS IV SOLN: INTRAVENOUS | Status: DC

## 2021-03-19 NOTE — ED Triage Notes (Signed)
Pt arrived [facility] from home complaining of weakness and abdominal pain since yesterday. Has had one episode of vomiting at home.

## 2021-03-19 NOTE — ED Provider Notes (Addendum)
[facility] EMERGENCY DEPARTMENT Provider Note   CSN: [id number] Arrival date & time: 03/06/2021  2306     History Chief Complaint  Patient presents with   Weakness    Michele Ellis is a 75 y.o. female.  The history is provided by the patient.  Weakness She has history of hypertension, hyperlipidemia and comes in because of lower abdominal pain which started yesterday and it is getting worse.  Pain is severe and she rates it at 9/10.  There is occasional radiation of pain to the back.  She has had associated nausea and vomiting.  She had some diarrhea 2 days ago, no bowel movement since then.  She has been able to pass a small amount of flatus.  She denies fever, chills, sweats.  Nothing makes her pain better, nothing makes it worse.  She has never had pain like this before.  She does have history of abdominal surgery-cholecystectomy.   Past Medical History:  Diagnosis Date   Carotid artery occlusion    Hyperlipidemia    Hypertension    Stroke ([place])    1990  below knee on right--balance is off    Patient Active Problem List   Diagnosis Date Noted   Aftercare following surgery of the circulatory system, NEC 12/11/2013   Essential hypertension, benign 01/23/2013   Nonspecific abnormal electrocardiogram (ECG) (EKG) 01/23/2013   Abnormal EKG 01/23/2013   Pre-operative cardiovascular examination 01/23/2013   Occlusion and stenosis of carotid artery without mention of cerebral infarction 12/25/2012    Past Surgical History:  Procedure Laterality Date   CAROTID ANGIOGRAM N/A 01/02/2013   Procedure: CAROTID ANGIOGRAM;  Surgeon: Elam Dutch, MD;  Location: [facility] CATH LAB;  Service: Cardiovascular;  Laterality: N/A;   CAROTID ENDARTERECTOMY Right 02/11/2013   CE   CHOLECYSTECTOMY     COLONOSCOPY  09/19/2020   Dr.Armbruster   ENDARTERECTOMY Right 02/11/2013   Procedure: ENDARTERECTOMY CAROTID;  Surgeon: Elam Dutch, MD;  Location: [place];  Service: Vascular;  Laterality:  Right;   FRACTURE SURGERY     RT arm   PATCH ANGIOPLASTY Right 02/11/2013   Procedure: PATCH ANGIOPLASTY OF RIGHT CAROTID ARTERY  USING HEMASHIELD PLATINUM FINESSE PATCH;  Surgeon: Elam Dutch, MD;  Location: [place];  Service: Vascular;  Laterality: Right;   rectal abscess     perirectal   RIGHT ARM     SURGERY, ALSO HAS METAL PLATE   UPPER GASTROINTESTINAL ENDOSCOPY  09/19/2020   Dr.Armbruster     OB History   No obstetric history on file.     Family History  Problem Relation Age of Onset   Cancer Father        Lung- Black Lung   Colon cancer Neg Hx    Esophageal cancer Neg Hx     Social History   Tobacco Use   Smoking status: Former    Packs/day: 1.00    Years: 25.00    Pack years: 25.00    Types: Cigarettes    Quit date: 03/26/1994    Years since quitting: 27.0   Smokeless tobacco: Never  Vaping Use   Vaping Use: Never used  Substance Use Topics   Alcohol use: No    Alcohol/week: 0.0 standard drinks   Drug use: No    Home Medications Prior to Admission medications   Medication Sig Start Date End Date Taking? Authorizing Provider  amLODipine (NORVASC) 10 MG tablet Take 10 mg by mouth daily.    [provider]  aspirin 325 MG EC tablet Take 325 mg by mouth daily.    [provider]  hydrochlorothiazide (HYDRODIURIL) 25 MG tablet Take 25 mg by mouth daily. 03/18/20   [provider]  indomethacin (INDOCIN) 25 MG capsule Take 25 mg by mouth 2 (two) times daily with a meal.    [provider]  lisinopril (ZESTRIL) 40 MG tablet Take 40 mg by mouth daily. 03/18/20   [provider]  pantoprazole (PROTONIX) 40 MG tablet Take 1 tablet (40 mg total) by mouth daily. 09/19/20   Armbruster, Carlota Raspberry, MD  Polyethyl Glycol-Propyl Glycol 0.4-0.3 % SOLN Apply 1-2 drops to eye daily as needed (for dry eyes).    [provider]    Allergies    Lipitor [atorvastatin] and Doxycycline  Review of Systems   Review of  Systems  Neurological:  Positive for weakness.  All other systems reviewed and are negative.  Physical Exam Updated Vital Signs BP 97/69 (BP Location: Right Arm)    Pulse 88    Temp 97.6 F (36.4 C) (Oral)    Ht 5\' 2"  (1.575 m)    Wt 45.4 kg    SpO2 100%    BMI 18.31 kg/m   Physical Exam Vitals and nursing note reviewed.  75 year old female, appears uncomfortable, but is in no acute distress. Vital signs are normal. Oxygen saturation is 100%, which is normal. Head is normocephalic and atraumatic. PERRLA, EOMI. Oropharynx is clear. Neck is nontender and supple without adenopathy or JVD. Back is nontender and there is no CVA tenderness. Lungs are clear without rales, wheezes, or rhonchi. Chest is nontender. Heart has regular rate and rhythm without murmur. Abdomen is soft, slightly distended, with tenderness diffusely.  Tenderness is maximum across the lower abdomen without any focal tenderness.  There is no rebound or guarding.  Peristalsis is hypoactive. Extremities have no cyanosis or edema, full range of motion is present. Skin is warm and dry without rash. Neurologic: Mental status is normal, cranial nerves are intact, there are no motor or sensory deficits.  ED Results / Procedures / Treatments   Labs (all labs ordered are listed, but only abnormal results are displayed) Labs Reviewed  COMPREHENSIVE METABOLIC PANEL - Abnormal; Notable for the following components:      Result Value   Potassium 3.1 (*)    Chloride 95 (*)    CO2 17 (*)    Glucose, Bld 231 (*)    BUN 47 (*)    Creatinine, Ser 1.55 (*)    Albumin 3.2 (*)    GFR, Estimated 35 (*)    Anion gap 24 (*)    All other components within normal limits  CBC WITH DIFFERENTIAL/PLATELET - Abnormal; Notable for the following components:   WBC 22.2 (*)    Hemoglobin 10.8 (*)    HCT 34.4 (*)    MCH 25.6 (*)    Neutro Abs 20.6 (*)    Abs Immature Granulocytes 0.18 (*)    All other components within normal limits   LACTIC ACID, PLASMA - Abnormal; Notable for the following components:   Lactic Acid, Venous 4.9 (*)    All other components within normal limits  RESP PANEL BY RT-PCR (FLU A&B, COVID) ARPGX2  LIPASE, BLOOD  URINALYSIS, ROUTINE W REFLEX MICROSCOPIC  LACTIC ACID, PLASMA   Radiology CT ABDOMEN PELVIS WO CONTRAST  Result Date: April 10, 2021 CLINICAL DATA:  Left lower quadrant pain weakness, initial encounter EXAM: CT ABDOMEN AND PELVIS WITHOUT CONTRAST TECHNIQUE:  Multidetector CT imaging of the abdomen and pelvis was performed following the standard protocol without IV contrast. COMPARISON:  Plain film from 03/18/2021, CT from 11/07/2020 FINDINGS: Lower chest: Lung bases are free of acute infiltrate or sizable effusion. Hepatobiliary: Liver demonstrates diffuse portal venous air throughout the liver as well as within the main portal vein and branches of the superior mesenteric artery. Gallbladder has been surgically removed. Pancreas: Pancreas is within normal limits. Spleen: Spleen demonstrates some air within it is substance as well as considerable air within branches of the splenic vein and IMV. Adrenals/Urinary Tract: Adrenal glands and kidneys are within normal limits. No renal calculi or obstructive changes are seen. Multiple cysts are noted bilaterally similar to that seen on prior CT examination. The bladder is partially distended. Stomach/Bowel: The colon distally shows no significant pneumatosis. Gaseous distension of the transverse colon and ascending colon is identified with some mild changes of pneumatosis within the ascending colon. The appendix is within normal limits. The distal small bowel is decompressed however the majority of the small bowel loops are significantly dilated consistent with obstructive change. The stomach is dilated with contrast and air as well. A definitive transition zone is not appreciated on this exam although likely lies in the distal ileum within the right lower  quadrant. The dilated small bowel loops demonstrate diffuse pneumatosis with significant air within the branches of the SMV and IMV contributing to the portal venous air. These changes are consistent with significant ischemia in the small bowel related to the obstructive process. Small sliding-type hiatal hernia is noted. Vascular/Lymphatic: Considerable portal venous air is noted with air throughout the main portal vein as well as branches of the IMV and SMV related to the ischemic bowel and small-bowel dilatation from partial obstruction. Additionally there are areas of air noted within branches of the splenic artery as well as branches of the superior mesenteric artery consistent with severe pneumatosis and arterial intravasation of air. Branches of the IMA also show air within. The abdominal aorta demonstrates diffuse atherosclerotic calcifications. 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 No significant lymphadenopathy is seen. Reproductive: Uterus and bilateral adnexa are unremarkable. Other: Mild free air is noted related to perforation this is mostly noted along the superior aspect of the abdomen. Musculoskeletal: No acute or significant osseous findings. IMPRESSION: High-grade partial small bowel obstruction identified in the mid to distal ileum with a transition zone in the right hemipelvis. This results in significant pneumatosis consistent with ischemic small bowel. Additionally small bowel perforation with mild free air is noted as well. Resultant portal venous air as well as air throughout the mesenteric venous structures as well as the splenic artery and arterial branches of the SMA and IMA. Critical Value/emergent results were called by telephone at the time of interpretation on 04-10-2021 at 2:19 am to Dr. Delora Fuel , who verbally acknowledged these results. Electronically Signed   By: Inez Catalina M.D.   On: 2021/04/10 02:35     Procedures Procedures  CRITICAL CARE Performed by: Delora Fuel Total critical care time: 45 minutes Critical care time was exclusive of separately billable procedures and treating other patients. Critical care was necessary to treat or prevent imminent or life-threatening deterioration. Critical care was time spent personally by me on the following activities: development of treatment plan with patient and/or surrogate as well as nursing, discussions with consultants, evaluation of patient's response to treatment, examination of patient, obtaining history from patient or surrogate, ordering  and performing treatments and interventions, ordering and review of laboratory studies, ordering and review of radiographic studies, pulse oximetry and re-evaluation of patient's condition.  Medications Ordered in ED Medications  lactated ringers infusion ( Intravenous New Bag/Given 2021/04/04 0114)  iohexol (OMNIPAQUE) 9 MG/ML oral solution (has no administration in time range)  lactated ringers bolus 1,000 mL (has no administration in time range)  morphine 4 MG/ML injection 4 mg (4 mg Intravenous Given 04/04/2021 0107)  ondansetron (ZOFRAN) injection 4 mg (4 mg Intravenous Given 2021-04-04 0105)  piperacillin-tazobactam (ZOSYN) IVPB 3.375 g (3.375 g Intravenous New Bag/Given 04/04/2021 0235)    ED Course  I have reviewed the triage vital signs and the nursing notes.  Pertinent labs & imaging results that were available during my care of the patient were reviewed by me and considered in my medical decision making (see chart for details).   MDM Rules/Calculators/A&P                         Lower abdominal pain, etiology unclear.  Consider diverticulitis, urinary tract infection, bowel obstruction, abdominal aortic aneurysm, urolithiasis.  Old records are reviewed, and it is noted that she had a colonoscopy on 09/19/2020 showing presence of diverticulosis.  CT of abdomen and pelvis on 11/07/2020 showed  calcified aorta without aneurysm, presence of renal cysts.  She will be given morphine, ondansetron and will be sent for CT of abdomen and pelvis.  CT scan shows evidence of free air in the abdomen with probable small bowel perforation and extensive air in the portal vein and hepatic veins.  Also superior and inferior mesenteric arteries.  Findings were discussed with Dr. Golden Circle, radiologist.  She is started on antibiotics of Zosyn and lactic acid level was ordered.  Case is discussed with Dr. Arnoldo Morale of general surgery service who is coming into consult on this case.  I have explained the findings to family including life-threatening nature of the findings.  Labs have come back showing stable anemia, but marked leukocytosis with left shift.  Chemistries show evidence of acute kidney injury with markedly elevated anion gap as well as hypokalemia.  Dr. Arnoldo Morale has spoken with family and decision is made to proceed with comfort care based on extensive findings on CT and for likelihood of good outcome with surgery.  Patient is made DNR.  Case is discussed with Dr. Clearence Ped of [facility] hospitalist, who agrees to admit the patient.  Final Clinical Impression(s) / ED Diagnoses Final diagnoses:  Ischemia, bowel ([place])  Acute kidney injury (nontraumatic) ([place])  Normocytic anemia  Hypokalemia    Rx / DC Orders ED Discharge Orders     None        Delora Fuel, MD 10/62/69 [phone]   Before patient could be admitted, blood pressure dropped and patient became less responsive.  She became apneic and pulseless and was pronounced dead at 3:43 AM.  Family was at the bedside.   Delora Fuel, MD 62/70/35 [phone]

## 2021-03-20 ENCOUNTER — Emergency Department (HOSPITAL_COMMUNITY): Payer: Medicare Other

## 2021-03-20 DIAGNOSIS — K55049 Acute infarction of large intestine, extent unspecified: Secondary | ICD-10-CM

## 2021-03-20 DIAGNOSIS — K559 Vascular disorder of intestine, unspecified: Secondary | ICD-10-CM | POA: Insufficient documentation

## 2021-03-20 DIAGNOSIS — K55029 Acute infarction of small intestine, extent unspecified: Secondary | ICD-10-CM

## 2021-03-20 LAB — LIPASE, BLOOD: Lipase: 31 U/L (ref 11–51)

## 2021-03-20 LAB — COMPREHENSIVE METABOLIC PANEL
ALT: 17 U/L (ref 0–44)
AST: 36 U/L (ref 15–41)
Albumin: 3.2 g/dL — ABNORMAL LOW (ref 3.5–5.0)
Alkaline Phosphatase: 63 U/L (ref 38–126)
Anion gap: 24 — ABNORMAL HIGH (ref 5–15)
BUN: 47 mg/dL — ABNORMAL HIGH (ref 8–23)
CO2: 17 mmol/L — ABNORMAL LOW (ref 22–32)
Calcium: 9.2 mg/dL (ref 8.9–10.3)
Chloride: 95 mmol/L — ABNORMAL LOW (ref 98–111)
Creatinine, Ser: 1.55 mg/dL — ABNORMAL HIGH (ref 0.44–1.00)
GFR, Estimated: 35 mL/min — ABNORMAL LOW (ref >60)
Glucose, Bld: 231 mg/dL — ABNORMAL HIGH (ref 70–99)
Potassium: 3.1 mmol/L — ABNORMAL LOW (ref 3.5–5.1)
Sodium: 136 mmol/L (ref 135–145)
Total Bilirubin: 0.8 mg/dL (ref 0.3–1.2)
Total Protein: 6.9 g/dL (ref 6.5–8.1)

## 2021-03-20 LAB — RESP PANEL BY RT-PCR (FLU A&B, COVID) ARPGX2
Influenza A by PCR: POSITIVE — AB
Influenza B by PCR: NEGATIVE
SARS Coronavirus 2 by RT PCR: NEGATIVE

## 2021-03-20 LAB — CBC WITH DIFFERENTIAL/PLATELET
Abs Immature Granulocytes: 0.18 10*3/uL — ABNORMAL HIGH (ref 0.00–0.07)
Basophils Absolute: 0.1 10*3/uL (ref 0.0–0.1)
Basophils Relative: 0 %
Eosinophils Absolute: 0 10*3/uL (ref 0.0–0.5)
Eosinophils Relative: 0 %
HCT: 34.4 % — ABNORMAL LOW (ref 36.0–46.0)
Hemoglobin: 10.8 g/dL — ABNORMAL LOW (ref 12.0–15.0)
Immature Granulocytes: 1 %
Lymphocytes Relative: 4 %
Lymphs Abs: 0.8 10*3/uL (ref 0.7–4.0)
MCH: 25.6 pg — ABNORMAL LOW (ref 26.0–34.0)
MCHC: 31.4 g/dL (ref 30.0–36.0)
MCV: 81.5 fL (ref 80.0–100.0)
Monocytes Absolute: 0.6 10*3/uL (ref 0.1–1.0)
Monocytes Relative: 3 %
Neutro Abs: 20.6 10*3/uL — ABNORMAL HIGH (ref 1.7–7.7)
Neutrophils Relative %: 92 %
Platelets: 342 10*3/uL (ref 150–400)
RBC: 4.22 MIL/uL (ref 3.87–5.11)
RDW: 14.6 % (ref 11.5–15.5)
WBC: 22.2 10*3/uL — ABNORMAL HIGH (ref 4.0–10.5)
nRBC: 0 % (ref 0.0–0.2)

## 2021-03-20 LAB — LACTIC ACID, PLASMA: Lactic Acid, Venous: 4.9 mmol/L (ref 0.5–1.9)

## 2021-03-20 MED ORDER — ACETAMINOPHEN 325 MG PO TABS: 650.0000 mg | ORAL_TABLET | Freq: Four times a day (QID) | ORAL | Status: DC | PRN

## 2021-03-20 MED ORDER — ACETAMINOPHEN 650 MG RE SUPP: 650.0000 mg | Freq: Four times a day (QID) | RECTAL | Status: DC | PRN

## 2021-03-20 MED ORDER — IOHEXOL 9 MG/ML PO SOLN
ORAL | Status: AC
  Filled 2021-03-20: qty 1000

## 2021-03-20 MED ORDER — GLYCOPYRROLATE 1 MG PO TABS
1.0000 mg | ORAL_TABLET | ORAL | Status: DC | PRN
  Filled 2021-03-20: qty 1

## 2021-03-20 MED ORDER — POLYVINYL ALCOHOL 1.4 % OP SOLN: 1.0000 [drp] | Freq: Four times a day (QID) | OPHTHALMIC | Status: DC | PRN

## 2021-03-20 MED ORDER — SODIUM CHLORIDE 0.9 % IV SOLN
12.5000 mg | Freq: Four times a day (QID) | INTRAVENOUS | Status: DC | PRN
  Filled 2021-03-20: qty 0.5

## 2021-03-20 MED ORDER — GLYCOPYRROLATE 0.2 MG/ML IJ SOLN: 0.2000 mg | INTRAMUSCULAR | Status: DC | PRN

## 2021-03-20 MED ORDER — PIPERACILLIN-TAZOBACTAM 3.375 G IVPB 30 MIN
3.3750 g | Freq: Once | INTRAVENOUS | Status: AC
  Administered 2021-03-20: 03:00:00 3.375 g via INTRAVENOUS
  Filled 2021-03-20: qty 50

## 2021-03-20 MED ORDER — ONDANSETRON 4 MG PO TBDP: 4.0000 mg | ORAL_TABLET | Freq: Four times a day (QID) | ORAL | Status: DC | PRN

## 2021-03-20 MED ORDER — LORAZEPAM 1 MG PO TABS: 1.0000 mg | ORAL_TABLET | ORAL | Status: DC | PRN

## 2021-03-20 MED ORDER — HALOPERIDOL LACTATE 5 MG/ML IJ SOLN: 0.5000 mg | INTRAMUSCULAR | Status: DC | PRN

## 2021-03-20 MED ORDER — LORAZEPAM 2 MG/ML IJ SOLN: 1.0000 mg | INTRAMUSCULAR | Status: DC | PRN

## 2021-03-20 MED ORDER — ONDANSETRON HCL 4 MG/2ML IJ SOLN: 4.0000 mg | Freq: Four times a day (QID) | INTRAMUSCULAR | Status: DC | PRN

## 2021-03-20 MED ORDER — HALOPERIDOL LACTATE 2 MG/ML PO CONC
0.5000 mg | ORAL | Status: DC | PRN
  Filled 2021-03-20: qty 0.3

## 2021-03-20 MED ORDER — OXYCODONE HCL 20 MG/ML PO CONC: 5.0000 mg | ORAL | Status: DC | PRN

## 2021-03-20 MED ORDER — LORAZEPAM 2 MG/ML PO CONC: 1.0000 mg | ORAL | Status: DC | PRN

## 2021-03-20 MED ORDER — IOHEXOL 300 MG/ML  SOLN: 75.0000 mL | Freq: Once | INTRAMUSCULAR | Status: DC | PRN

## 2021-03-20 MED ORDER — BIOTENE DRY MOUTH MT LIQD: 15.0000 mL | OROMUCOSAL | Status: DC | PRN

## 2021-03-20 MED ORDER — MORPHINE SULFATE (PF) 2 MG/ML IV SOLN: 1.0000 mg | INTRAVENOUS | Status: DC | PRN

## 2021-03-20 MED ORDER — HALOPERIDOL 0.5 MG PO TABS: 0.5000 mg | ORAL_TABLET | ORAL | Status: DC | PRN

## 2021-03-20 MED ORDER — LACTATED RINGERS IV BOLUS: 1000.0000 mL | Freq: Once | INTRAVENOUS | Status: DC

## 2021-03-26 NOTE — Consult Note (Signed)
Reason for Consult: Pneumoperitoneum Referring Physician: Dr. Cheral Almas is an 76 y.o. female.  HPI: Patient is a 76 year old white female who was brought into the emergency room for abdominal pain and weakness by EMS.  CT scan of the abdomen reveals pneumoperitoneum, pneumatosis, and portal venous gas.  As per family, patient was seen yesterday at [facility] for abdominal pain and plain x-rays did not reveal free air.  There was a concern for her peptic ulcer.  Patient is not responsive.  Past Medical History:  Diagnosis Date   Carotid artery occlusion    Hyperlipidemia    Hypertension    Stroke ([place])    1990  below knee on right--balance is off    Past Surgical History:  Procedure Laterality Date   CAROTID ANGIOGRAM N/A 01/02/2013   Procedure: CAROTID ANGIOGRAM;  Surgeon: Elam Dutch, MD;  Location: [facility] CATH LAB;  Service: Cardiovascular;  Laterality: N/A;   CAROTID ENDARTERECTOMY Right 02/11/2013   CE   CHOLECYSTECTOMY     COLONOSCOPY  09/19/2020   Dr.Armbruster   ENDARTERECTOMY Right 02/11/2013   Procedure: ENDARTERECTOMY CAROTID;  Surgeon: Elam Dutch, MD;  Location: [place];  Service: Vascular;  Laterality: Right;   FRACTURE SURGERY     RT arm   PATCH ANGIOPLASTY Right 02/11/2013   Procedure: PATCH ANGIOPLASTY OF RIGHT CAROTID ARTERY  USING HEMASHIELD PLATINUM FINESSE PATCH;  Surgeon: Elam Dutch, MD;  Location: [facility] OR;  Service: Vascular;  Laterality: Right;   rectal abscess     perirectal   RIGHT ARM     SURGERY, ALSO HAS METAL PLATE   UPPER GASTROINTESTINAL ENDOSCOPY  09/19/2020   Dr.Armbruster    Family History  Problem Relation Age of Onset   Cancer Father        Lung- Black Lung   Colon cancer Neg Hx    Esophageal cancer Neg Hx     Social History:  reports that she quit smoking about 27 years ago. Her smoking use included cigarettes. She has a 25.00 pack-year smoking history. She has never used smokeless tobacco. She reports that  she does not drink alcohol and does not use drugs.  Allergies:  Allergies  Allergen Reactions   Lipitor [Atorvastatin] Other (See Comments)    Abnormal liver enzymes     Doxycycline Rash    Rash on hands and feet.    Medications: Prior to Admission: (Not in a hospital admission)   Results for orders placed or performed during the hospital encounter of 02/28/2021 (from the past 48 hour(s))  Comprehensive metabolic panel     Status: Abnormal   Collection Time: April 03, 2021  1:42 AM  Result Value Ref Range   Sodium 136 135 - 145 mmol/L   Potassium 3.1 (L) 3.5 - 5.1 mmol/L   Chloride 95 (L) 98 - 111 mmol/L   CO2 17 (L) 22 - 32 mmol/L   Glucose, Bld 231 (H) 70 - 99 mg/dL    Comment: Glucose reference range applies only to samples taken after fasting for at least 8 hours.   BUN 47 (H) 8 - 23 mg/dL   Creatinine, Ser 1.55 (H) 0.44 - 1.00 mg/dL   Calcium 9.2 8.9 - 10.3 mg/dL   Total Protein 6.9 6.5 - 8.1 g/dL   Albumin 3.2 (L) 3.5 - 5.0 g/dL   AST 36 15 - 41 U/L   ALT 17 0 - 44 U/L   Alkaline Phosphatase 63 38 - 126 U/L   Total  Bilirubin 0.8 0.3 - 1.2 mg/dL   GFR, Estimated 35 (L) >60 mL/min    Comment: (NOTE) Calculated using the CKD-EPI Creatinine Equation (2021)    Anion gap 24 (H) 5 - 15    Comment: Electrolytes repeated to confirm. Performed at [facility], [address], [place], [place] [address]   Lipase, blood     Status: None   Collection Time: 04-Apr-2021  1:42 AM  Result Value Ref Range   Lipase 31 11 - 51 U/L    Comment: Performed at [facility], [address] [place], [place] [address]  CBC with Differential     Status: Abnormal   Collection Time: April 04, 2021  1:42 AM  Result Value Ref Range   WBC 22.2 (H) 4.0 - 10.5 K/uL   RBC 4.22 3.87 - 5.11 MIL/uL   Hemoglobin 10.8 (L) 12.0 - 15.0 g/dL   HCT 34.4 (L) 36.0 - 46.0 %   MCV 81.5 80.0 - 100.0 fL   MCH 25.6 (L) 26.0 - 34.0 pg   MCHC 31.4 30.0 - 36.0 g/dL   RDW 14.6 11.5 - 15.5 %   Platelets 342 150 - 400 K/uL    nRBC 0.0 0.0 - 0.2 %   Neutrophils Relative % 92 %   Neutro Abs 20.6 (H) 1.7 - 7.7 K/uL   Lymphocytes Relative 4 %   Lymphs Abs 0.8 0.7 - 4.0 K/uL   Monocytes Relative 3 %   Monocytes Absolute 0.6 0.1 - 1.0 K/uL   Eosinophils Relative 0 %   Eosinophils Absolute 0.0 0.0 - 0.5 K/uL   Basophils Relative 0 %   Basophils Absolute 0.1 0.0 - 0.1 K/uL   Immature Granulocytes 1 %   Abs Immature Granulocytes 0.18 (H) 0.00 - 0.07 K/uL    Comment: Performed at [facility], [address] [place], [place] [address]  Lactic acid, plasma     Status: Abnormal   Collection Time: Apr 04, 2021  1:42 AM  Result Value Ref Range   Lactic Acid, Venous 4.9 (HH) 0.5 - 1.9 mmol/L    Comment: CRITICAL RESULT CALLED TO, READ BACK BY AND VERIFIED WITH: SPENCE,H @ 0242 ON 04/04/21 BY JUW Performed at [facility], [address] [place], [place] [address]     CT ABDOMEN PELVIS WO CONTRAST  Result Date: 04-04-2021 CLINICAL DATA:  Left lower quadrant pain weakness, initial encounter EXAM: CT ABDOMEN AND PELVIS WITHOUT CONTRAST TECHNIQUE: Multidetector CT imaging of the abdomen and pelvis was performed following the standard protocol without IV contrast. COMPARISON:  Plain film from 03/18/2021, CT from 11/07/2020 FINDINGS: Lower chest: Lung bases are free of acute infiltrate or sizable effusion. Hepatobiliary: Liver demonstrates diffuse portal venous air throughout the liver as well as within the main portal vein and branches of the superior mesenteric artery. Gallbladder has been surgically removed. Pancreas: Pancreas is within normal limits. Spleen: Spleen demonstrates some air within it is substance as well as considerable air within branches of the splenic vein and IMV. Adrenals/Urinary Tract: Adrenal glands and kidneys are within normal limits. No renal calculi or obstructive changes are seen. Multiple cysts are noted bilaterally similar to that seen on prior CT examination. The bladder is partially distended.  Stomach/Bowel: The colon distally shows no significant pneumatosis. Gaseous distension of the transverse colon and ascending colon is identified with some mild changes of pneumatosis within the ascending colon. The appendix is within normal limits. The distal small bowel is decompressed however the majority of the small bowel loops are significantly  dilated consistent with obstructive change. The stomach is dilated with contrast and air as well. A definitive transition zone is not appreciated on this exam although likely lies in the distal ileum within the right lower quadrant. The dilated small bowel loops demonstrate diffuse pneumatosis with significant air within the branches of the SMV and IMV contributing to the portal venous air. These changes are consistent with significant ischemia in the small bowel related to the obstructive process. Small sliding-type hiatal hernia is noted. Vascular/Lymphatic: Considerable portal venous air is noted with air throughout the main portal vein as well as branches of the IMV and SMV related to the ischemic bowel and small-bowel dilatation from partial obstruction. Additionally there are areas of air noted within branches of the splenic artery as well as branches of the superior mesenteric artery consistent with severe pneumatosis and arterial intravasation of air. Branches of the IMA also show air within. The abdominal aorta demonstrates diffuse atherosclerotic calcifications. 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 No significant lymphadenopathy is seen. Reproductive: Uterus and bilateral adnexa are unremarkable. Other: Mild free air is noted related to perforation this is mostly noted along the superior aspect of the abdomen. Musculoskeletal: No acute or significant osseous findings. IMPRESSION: High-grade partial small bowel obstruction identified in the mid to distal ileum with a transition zone in the right hemipelvis.  This results in significant pneumatosis consistent with ischemic small bowel. Additionally small bowel perforation with mild free air is noted as well. Resultant portal venous air as well as air throughout the mesenteric venous structures as well as the splenic artery and arterial branches of the SMA and IMA. Critical Value/emergent results were called by telephone at the time of interpretation on 24-Mar-2021 at 2:19 am to Dr. Delora Fuel , who verbally acknowledged these results. Electronically Signed   By: Inez Catalina M.D.   On: 03/24/2021 02:35    ROS:  Review of systems not obtained due to patient factors.  Blood pressure (!) 61/51, pulse 100, temperature 97.6 F (36.4 C), temperature source Oral, resp. rate (!) 32, height 5\' 2"  (1.575 m), weight 45.4 kg, SpO2 99 %. Physical Exam: Obtunded white female Abdomen is tense and distended.  Rigidity is present.  CT scan images personally reviewed  Assessment/Plan: Impression: Significant infarction of small bowel and ascending colon with pneumoperitoneum, pneumatosis, and portal venous gas.  Patient is hypotensive and obtunded.  This is not a recoverable event.  I had a discussion with the family and told them that operative therapy would be futile and death is imminent.  They understand and agreed to make the patient DNR, comfort measures only.  Discussed with Dr. Roxanne Mins.  Aviva Signs March 24, 2021, 3:21 AM

## 2021-03-26 NOTE — ED Notes (Signed)
Patient back from CT.

## 2021-03-26 NOTE — ED Notes (Addendum)
Patient transported to CT 

## 2021-03-26 NOTE — ED Notes (Signed)
TOD  0343  Family in room during this time. EDP Roxanne Mins and primary RN in room during this time

## 2021-03-26 DEATH — deceased

## 2022-02-11 IMAGING — CT CT ABD-PELV W/ CM
2 of 5 series · 16 of 46 positions shown, 18 images · IV contrast (APPLIED)
Comparison: None.

CLINICAL DATA: Weight loss, abdominal pain

EXAM:
CT ABDOMEN AND PELVIS WITH CONTRAST
TECHNIQUE: Multidetector CT imaging of the abdomen and pelvis was performed
using the standard protocol following bolus administration of
intravenous contrast.
CONTRAST:  50mL OMNIPAQUE IOHEXOL 350 MG/ML SOLN

[Series 2: abd pel w · axial · 0.69mm/px · z∈[+985,+1360]mm · 13 of 85 slices shown, 15 images]
[im 5/85  soft-tissue]
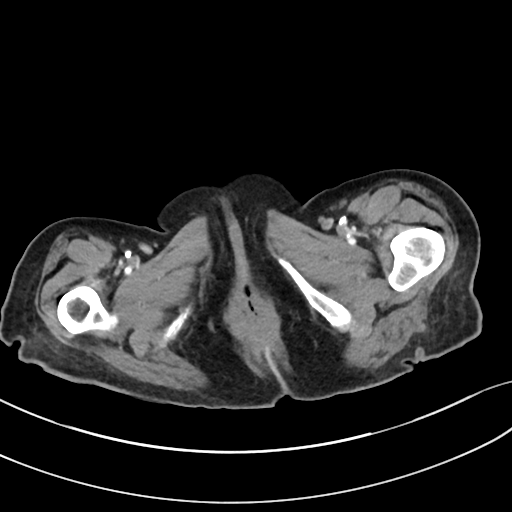
[im 5/85  bone]
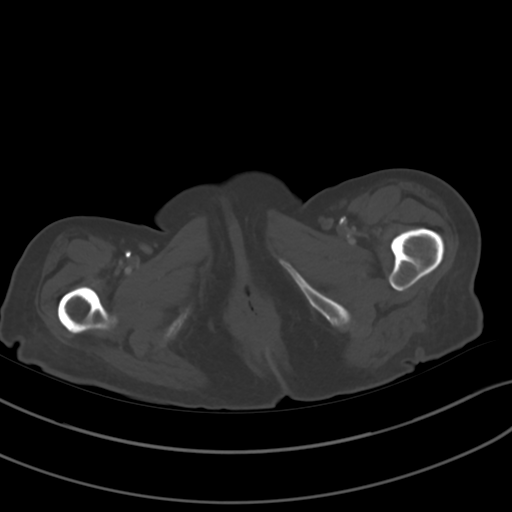
[im 14/85  soft-tissue]
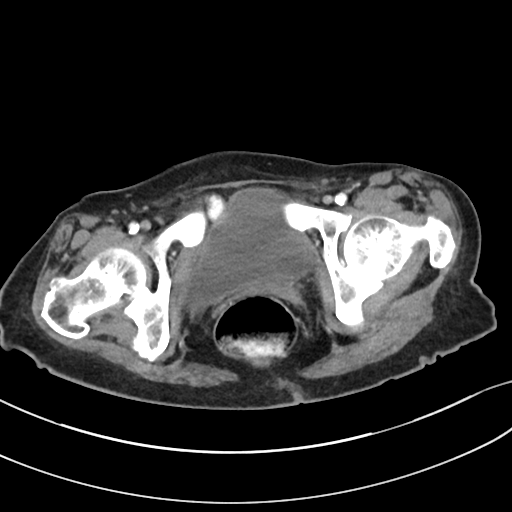
[im 18/85  soft-tissue]
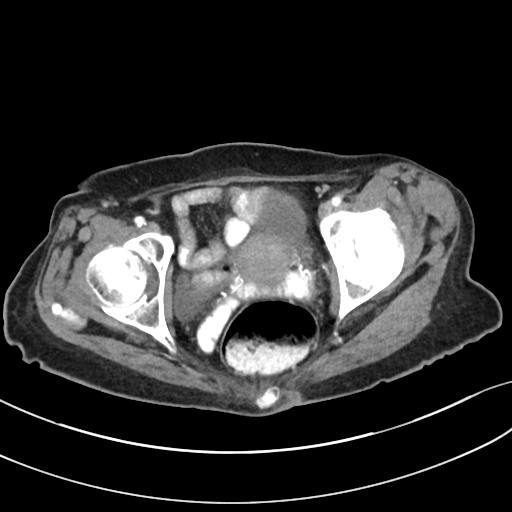
[im 23/85  soft-tissue]
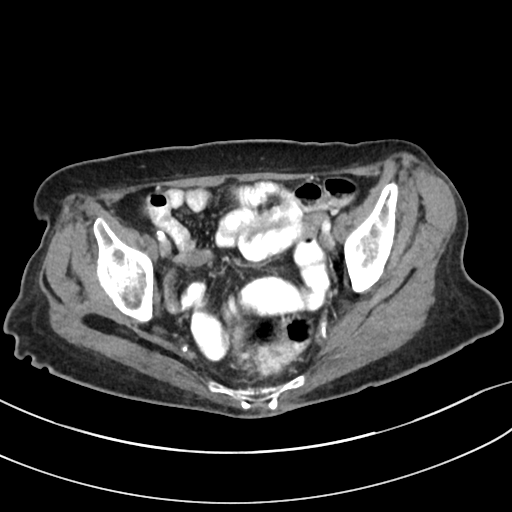
[im 31/85  soft-tissue]
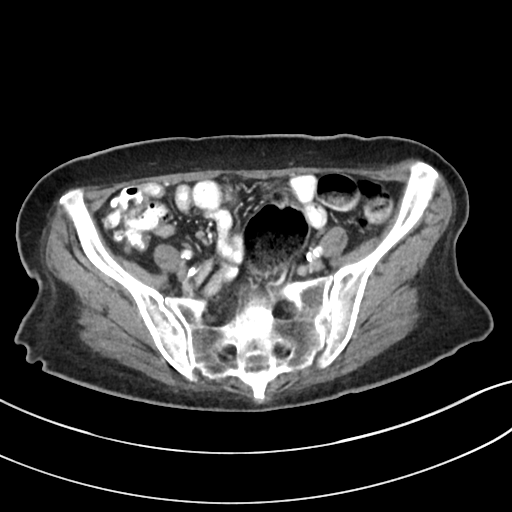
[im 36/85  soft-tissue]
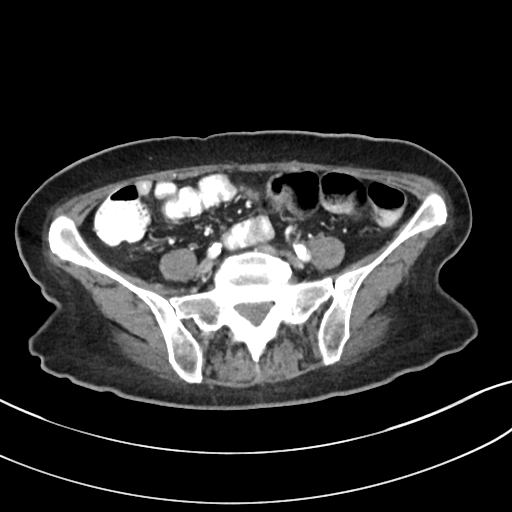
[im 45/85  soft-tissue]
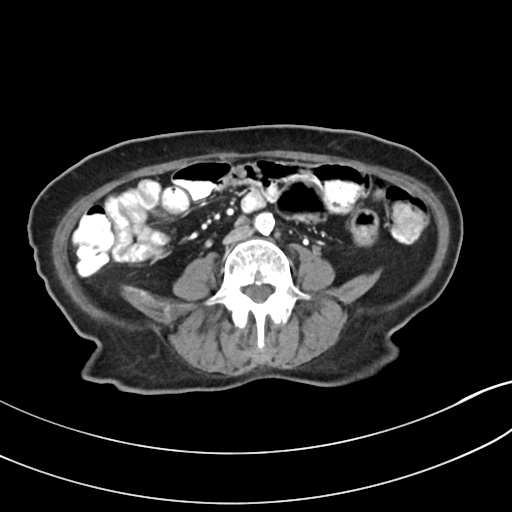
[im 49/85  soft-tissue]
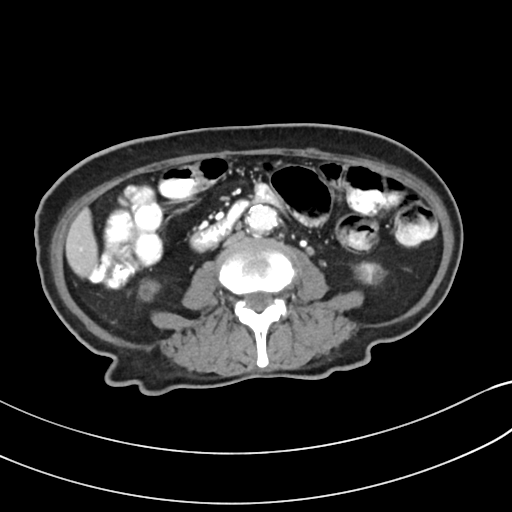
[im 54/85  soft-tissue]
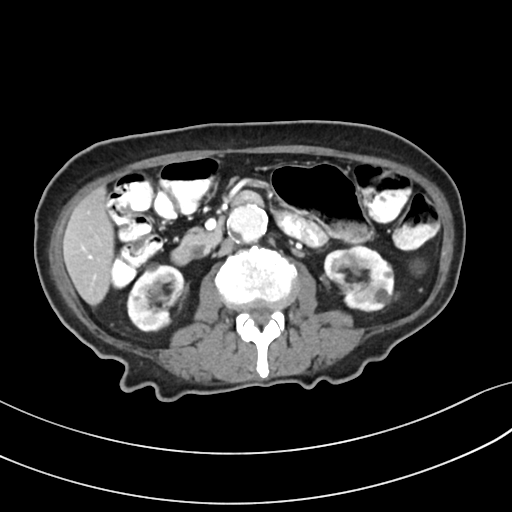
[im 54/85  bone]
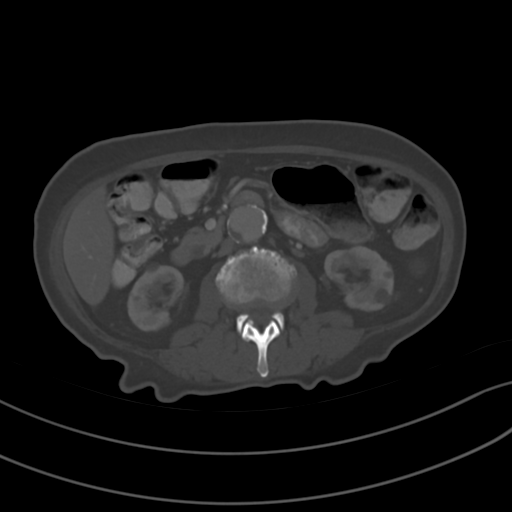
[im 62/85  soft-tissue]
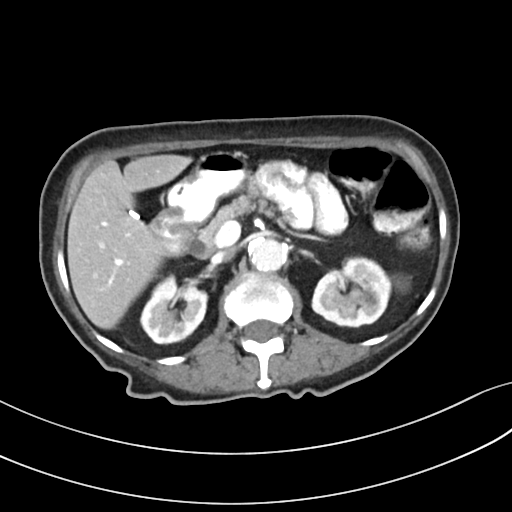
[im 67/85  soft-tissue]
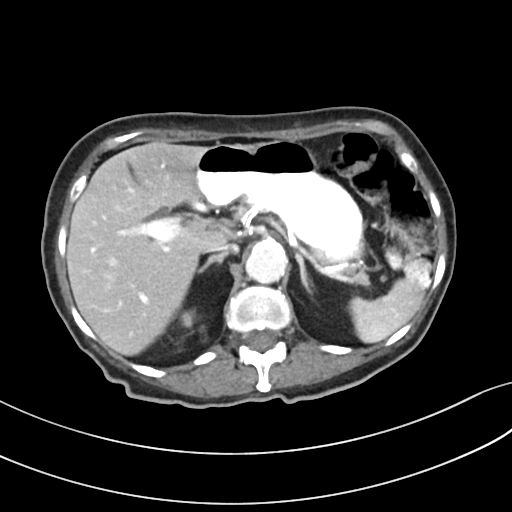
[im 71/85  soft-tissue]
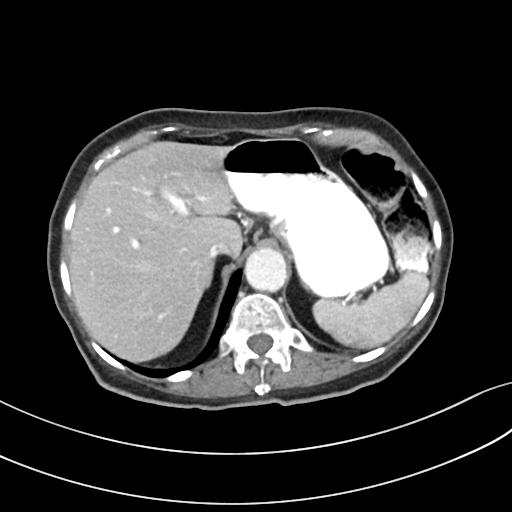
[im 80/85  soft-tissue]
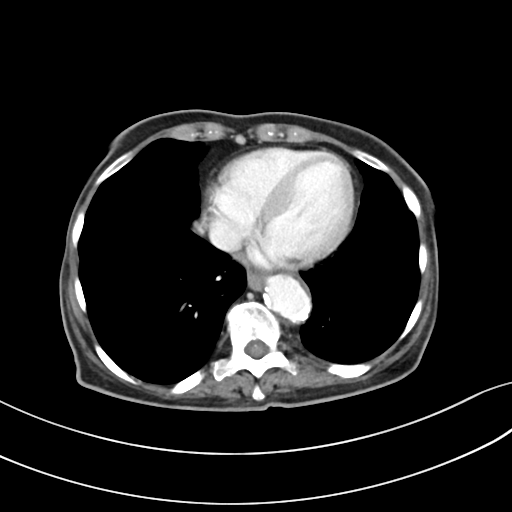

[Series 5: coronal · coronal · 0.69mm/px · 3 of 75 slices shown]
[im 25/75  soft-tissue]
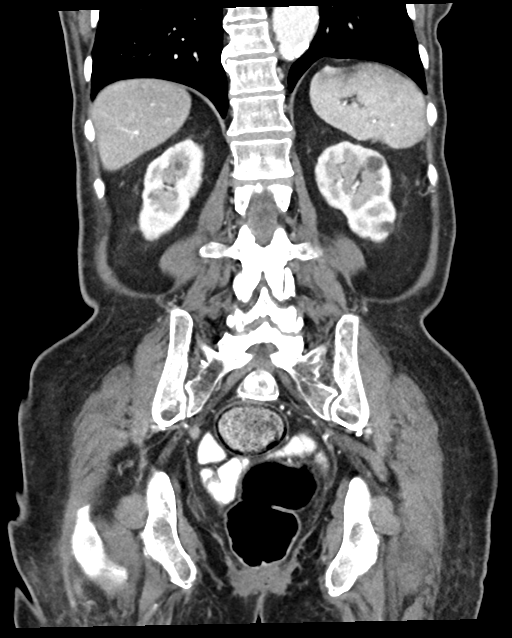
[im 33/75  soft-tissue]
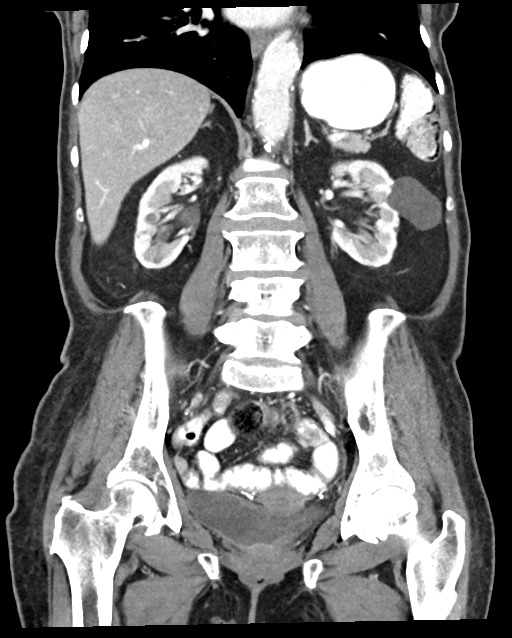
[im 42/75  soft-tissue]
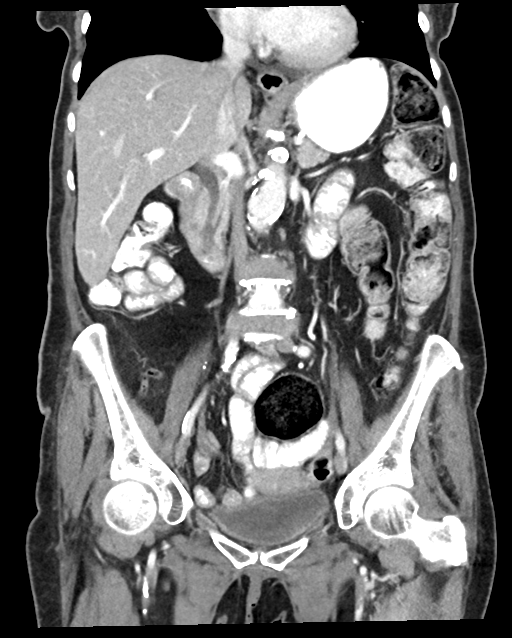

[16 of 46 positions shown; findings below may reference images not displayed]

FINDINGS: Lower chest: Aortic atherosclerosis.  No acute abnormality.

Hepatobiliary: No focal liver abnormality is seen. Status post
cholecystectomy. No biliary dilatation. Mildly prominent, La ducts,
likely related to post cholecystectomy state.

Pancreas: No focal abnormality or ductal dilatation.

Spleen: No focal abnormality.  Normal size.

Adrenals/Urinary Tract: Low-density lesions within the kidneys
bilaterally most compatible with cysts, the largest in the midpole
of the left kidney measuring up to 3.4 cm. No hydronephrosis or
stones. Adrenal glands and urinary bladder unremarkable.

Stomach/Bowel: Normal appendix. Moderate stool throughout the colon.
Stomach, large and small bowel grossly unremarkable.

Vascular/Lymphatic: Heavily calcified aorta and iliac vessels. No
aneurysm or adenopathy.

Reproductive: Uterus and adnexa unremarkable.  No mass.

Other: No free fluid or free air.

Musculoskeletal: No acute bony abnormality.
IMPRESSION: No acute findings in the abdomen or pelvis.

Aortic atherosclerosis.

## 2022-06-24 IMAGING — CT CT ABD-PELV W/O CM
2 of 4 series · 15 of 46 positions shown, 17 images · non-contrast
Comparison: Plain film from 03/18/2021, CT from 11/07/2020

CLINICAL DATA: Left lower quadrant pain weakness, initial encounter

EXAM:
CT ABDOMEN AND PELVIS WITHOUT CONTRAST
TECHNIQUE: Multidetector CT imaging of the abdomen and pelvis was performed
following the standard protocol without IV contrast.

[Series 2: axial st · axial · 0.68mm/px · z∈[+665,+1100]mm · 12 of 99 slices shown, 14 images]
[im 6/99  soft-tissue]
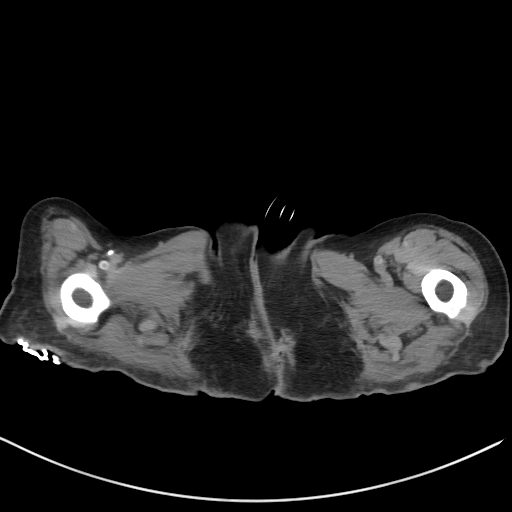
[im 6/99  bone]
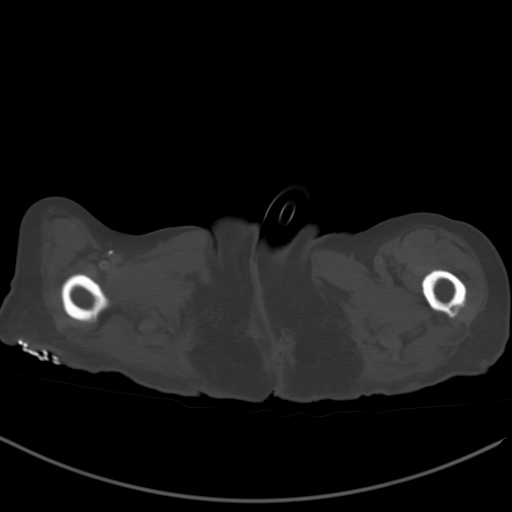
[im 17/99  soft-tissue]
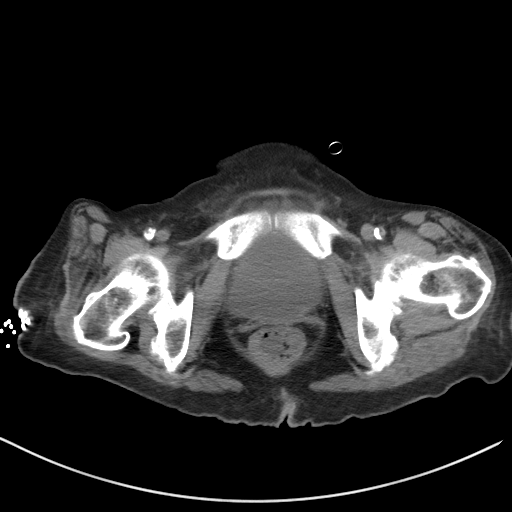
[im 22/99  soft-tissue]
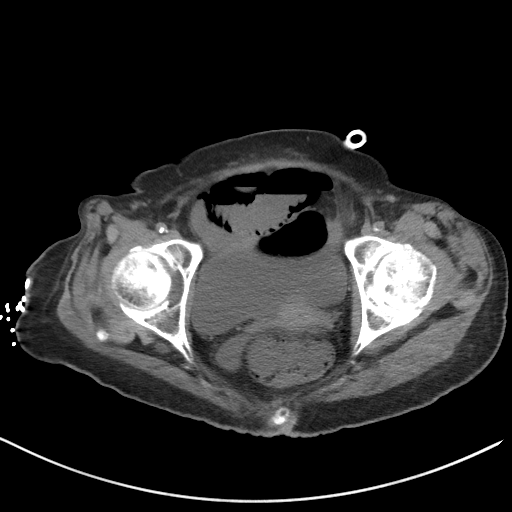
[im 28/99  soft-tissue]
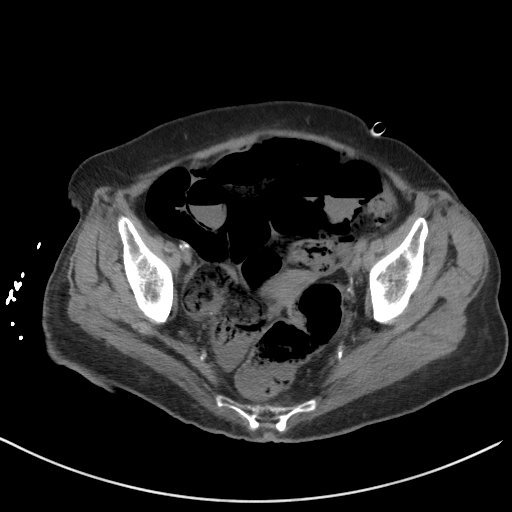
[im 39/99  soft-tissue]
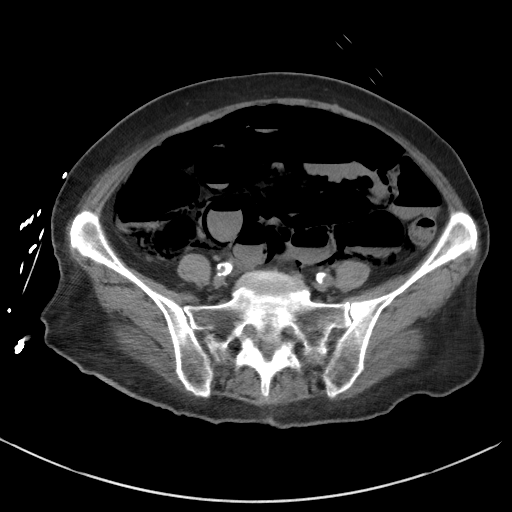
[im 44/99  soft-tissue]
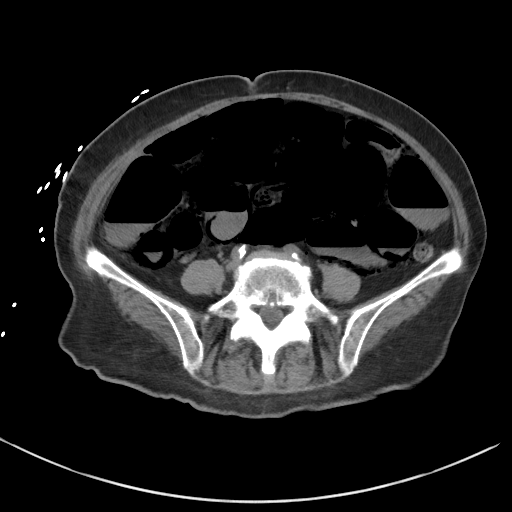
[im 55/99  soft-tissue]
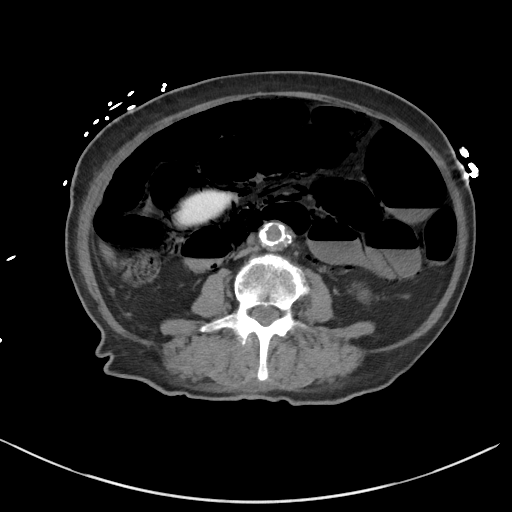
[im 60/99  soft-tissue]
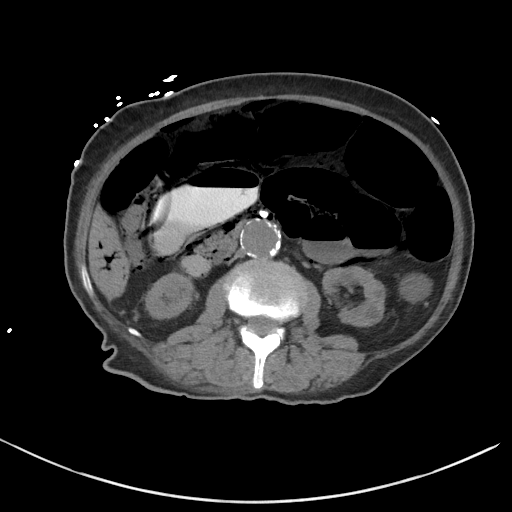
[im 71/99  soft-tissue]
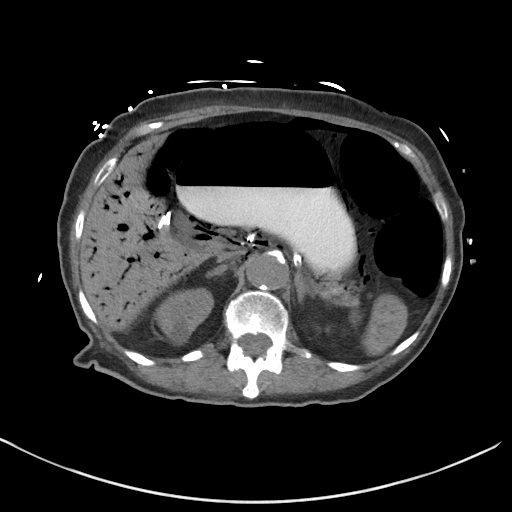
[im 71/99  bone]
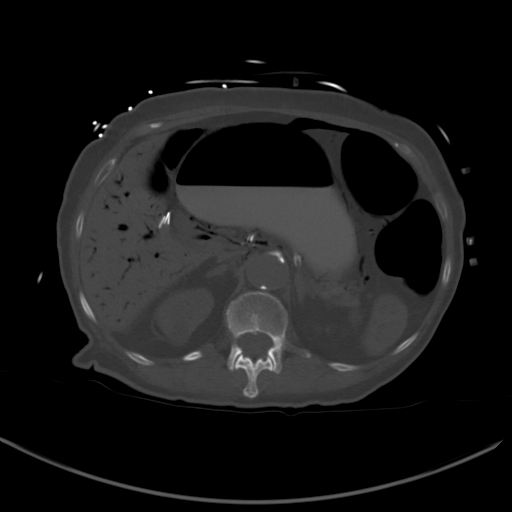
[im 77/99  soft-tissue]
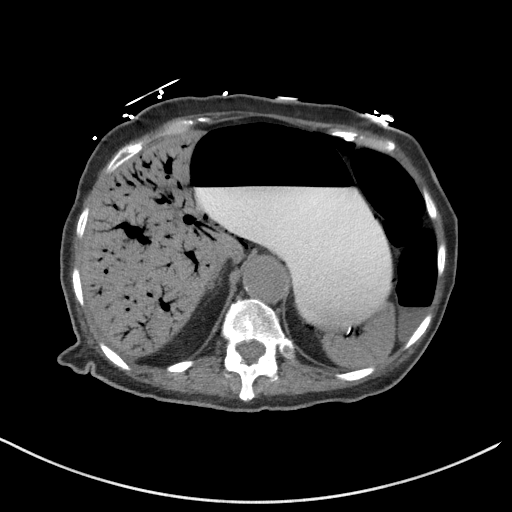
[im 82/99  soft-tissue]
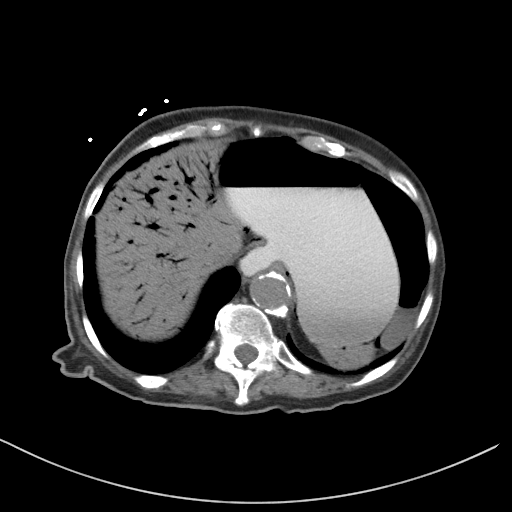
[im 93/99  soft-tissue]
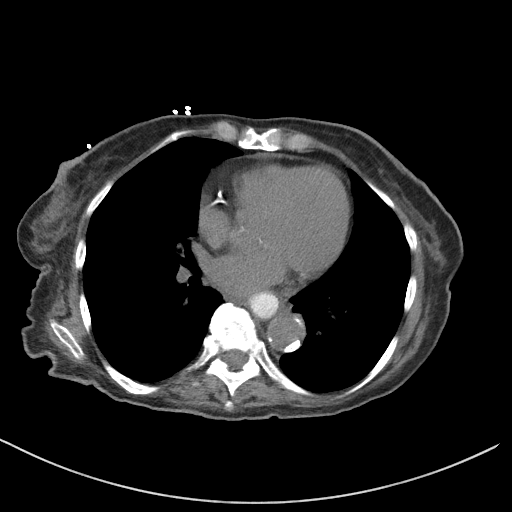

[Series 5: coronal st · coronal · 0.78mm/px · 3 of 97 slices shown]
[im 33/97  soft-tissue]
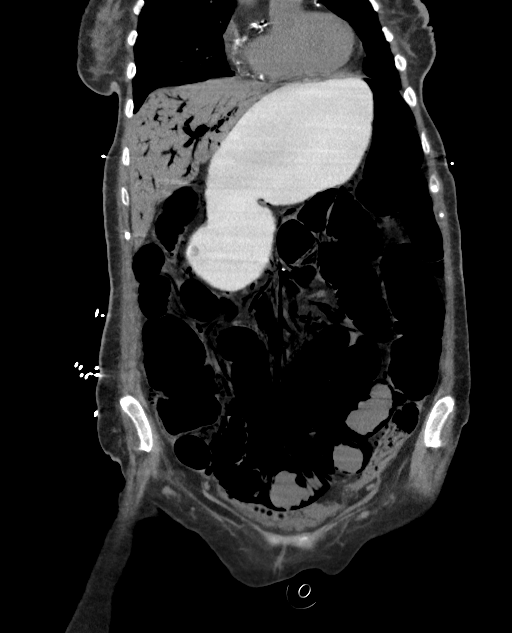
[im 43/97  soft-tissue]
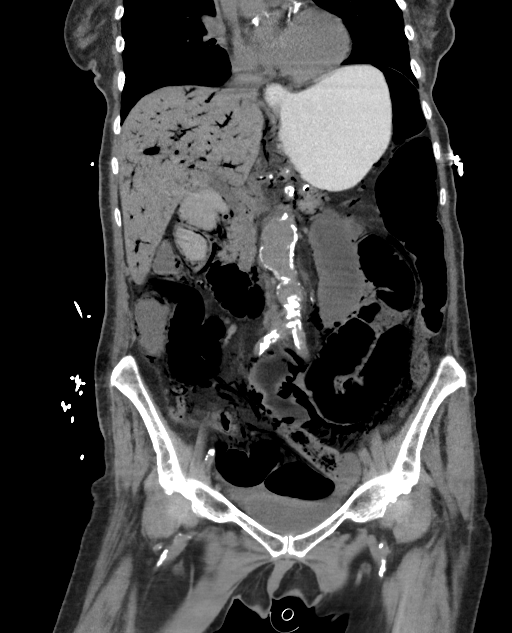
[im 54/97  soft-tissue]
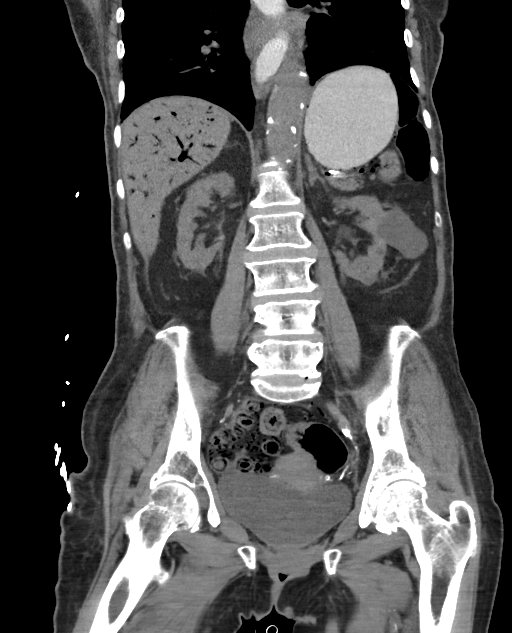

[15 of 46 positions shown; findings below may reference images not displayed]

FINDINGS: Lower chest: Lung bases are free of acute infiltrate or sizable
effusion.

Hepatobiliary: Liver demonstrates diffuse portal venous air
throughout the liver as well as within the main portal vein and
branches of the superior mesenteric artery. Gallbladder has been
surgically removed.

Pancreas: Pancreas is within normal limits.

Spleen: Spleen demonstrates some air within it is substance as well
as considerable air within branches of the splenic vein and IMV.

Adrenals/Urinary Tract: Adrenal glands and kidneys are within normal
limits. No renal calculi or obstructive changes are seen. Multiple
cysts are noted bilaterally similar to that seen on prior CT
examination. The bladder is partially distended.

Stomach/Bowel: The colon distally shows no significant pneumatosis.
Gaseous distension of the transverse colon and ascending colon is
identified with some mild changes of pneumatosis within the
ascending colon. The appendix is within normal limits. The distal
small bowel is decompressed however the majority of the small bowel
loops are significantly dilated consistent with obstructive change.
The stomach is dilated with contrast and air as well. A definitive
transition zone is not appreciated on this exam although likely lies
in the distal ileum within the right lower quadrant. The dilated
small bowel loops demonstrate diffuse pneumatosis with significant
air within the branches of the SMV and IMV contributing to the
portal venous air. These changes are consistent with significant
ischemia in the small bowel related to the obstructive process.
Small sliding-type hiatal hernia is noted.

Vascular/Lymphatic: Considerable portal venous air is noted with air
throughout the main portal vein as well as branches of the IMV and
SMV related to the ischemic bowel and small-bowel dilatation from
partial obstruction. Additionally there are areas of air noted
within branches of the splenic artery as well as branches of the
superior mesenteric artery consistent with severe pneumatosis and
arterial intravasation of air. Branches of the IMA also show air
within. The abdominal aorta demonstrates diffuse atherosclerotic
calcifications. Findings of chronic dissection are noted in the
infrarenal aorta stable from prior exam. No significant
lymphadenopathy is seen.

Reproductive: Uterus and bilateral adnexa are unremarkable.

Other: Mild free air is noted related to perforation this is mostly
noted along the superior aspect of the abdomen.

Musculoskeletal: No acute or significant osseous findings.
IMPRESSION: High-grade partial small bowel obstruction identified in the mid to
distal ileum with a transition zone in the right hemipelvis. This
results in significant pneumatosis consistent with ischemic small
bowel. Additionally small bowel perforation with mild free air is
noted as well.

Resultant portal venous air as well as air throughout the mesenteric
venous structures as well as the splenic artery and arterial
branches of the SMA and IMA.

Critical Value/emergent results were called by telephone at the time
of interpretation on 03/20/2021 at [DATE] to Dr. TIN CHEUNG BORRES , who
verbally acknowledged these results.
